# Patient Record
Sex: Male | Born: 1955 | Race: White | Hispanic: No | Marital: Married | Smoking: Never smoker
Health system: Southern US, Community
[De-identification: ages and names within clinical notes are randomized; demographics above are authoritative.]

## PROBLEM LIST (undated history)

## (undated) DIAGNOSIS — J45909 Unspecified asthma, uncomplicated: Secondary | ICD-10-CM

## (undated) HISTORY — DX: Unspecified asthma, uncomplicated: J45.909

## (undated) HISTORY — PX: HIP SURGERY: SHX245

---

## 2003-09-28 ENCOUNTER — Ambulatory Visit (HOSPITAL_COMMUNITY): Admission: RE | Admit: 2003-09-28 | Discharge: 2003-09-28 | Payer: Self-pay | Admitting: Gastroenterology

## 2005-05-02 ENCOUNTER — Ambulatory Visit (HOSPITAL_COMMUNITY): Admission: RE | Admit: 2005-05-02 | Discharge: 2005-05-02 | Payer: Self-pay | Admitting: Surgery

## 2014-04-16 ENCOUNTER — Other Ambulatory Visit: Payer: Self-pay | Admitting: Gastroenterology

## 2016-08-22 DIAGNOSIS — L820 Inflamed seborrheic keratosis: Secondary | ICD-10-CM | POA: Diagnosis not present

## 2016-08-22 DIAGNOSIS — D2262 Melanocytic nevi of left upper limb, including shoulder: Secondary | ICD-10-CM | POA: Diagnosis not present

## 2016-08-22 DIAGNOSIS — L814 Other melanin hyperpigmentation: Secondary | ICD-10-CM | POA: Diagnosis not present

## 2016-08-22 DIAGNOSIS — B078 Other viral warts: Secondary | ICD-10-CM | POA: Diagnosis not present

## 2016-08-22 DIAGNOSIS — L821 Other seborrheic keratosis: Secondary | ICD-10-CM | POA: Diagnosis not present

## 2017-04-13 DIAGNOSIS — Z23 Encounter for immunization: Secondary | ICD-10-CM | POA: Diagnosis not present

## 2017-05-29 DIAGNOSIS — Z13220 Encounter for screening for lipoid disorders: Secondary | ICD-10-CM | POA: Diagnosis not present

## 2017-05-29 DIAGNOSIS — Z0000 Encounter for general adult medical examination without abnormal findings: Secondary | ICD-10-CM | POA: Diagnosis not present

## 2017-06-26 DIAGNOSIS — R972 Elevated prostate specific antigen [PSA]: Secondary | ICD-10-CM | POA: Diagnosis not present

## 2017-07-23 DIAGNOSIS — H1013 Acute atopic conjunctivitis, bilateral: Secondary | ICD-10-CM | POA: Diagnosis not present

## 2017-07-30 DIAGNOSIS — H10413 Chronic giant papillary conjunctivitis, bilateral: Secondary | ICD-10-CM | POA: Diagnosis not present

## 2017-08-02 DIAGNOSIS — R972 Elevated prostate specific antigen [PSA]: Secondary | ICD-10-CM | POA: Diagnosis not present

## 2017-08-02 DIAGNOSIS — N401 Enlarged prostate with lower urinary tract symptoms: Secondary | ICD-10-CM | POA: Diagnosis not present

## 2017-10-02 DIAGNOSIS — D225 Melanocytic nevi of trunk: Secondary | ICD-10-CM | POA: Diagnosis not present

## 2017-10-02 DIAGNOSIS — L821 Other seborrheic keratosis: Secondary | ICD-10-CM | POA: Diagnosis not present

## 2017-10-30 DIAGNOSIS — Z8042 Family history of malignant neoplasm of prostate: Secondary | ICD-10-CM | POA: Diagnosis not present

## 2017-10-30 DIAGNOSIS — R972 Elevated prostate specific antigen [PSA]: Secondary | ICD-10-CM | POA: Diagnosis not present

## 2018-01-16 DIAGNOSIS — H0014 Chalazion left upper eyelid: Secondary | ICD-10-CM | POA: Diagnosis not present

## 2018-01-22 DIAGNOSIS — H0014 Chalazion left upper eyelid: Secondary | ICD-10-CM | POA: Diagnosis not present

## 2018-01-23 DIAGNOSIS — H0014 Chalazion left upper eyelid: Secondary | ICD-10-CM | POA: Diagnosis not present

## 2018-01-31 DIAGNOSIS — Z8042 Family history of malignant neoplasm of prostate: Secondary | ICD-10-CM | POA: Diagnosis not present

## 2018-01-31 DIAGNOSIS — N138 Other obstructive and reflux uropathy: Secondary | ICD-10-CM | POA: Diagnosis not present

## 2018-01-31 DIAGNOSIS — R972 Elevated prostate specific antigen [PSA]: Secondary | ICD-10-CM | POA: Diagnosis not present

## 2018-04-18 DIAGNOSIS — Z23 Encounter for immunization: Secondary | ICD-10-CM | POA: Diagnosis not present

## 2018-07-03 ENCOUNTER — Other Ambulatory Visit: Payer: Self-pay | Admitting: Family Medicine

## 2018-07-03 DIAGNOSIS — M8580 Other specified disorders of bone density and structure, unspecified site: Secondary | ICD-10-CM

## 2018-07-03 DIAGNOSIS — J4530 Mild persistent asthma, uncomplicated: Secondary | ICD-10-CM | POA: Diagnosis not present

## 2018-07-03 DIAGNOSIS — Z13220 Encounter for screening for lipoid disorders: Secondary | ICD-10-CM | POA: Diagnosis not present

## 2018-07-03 DIAGNOSIS — Z23 Encounter for immunization: Secondary | ICD-10-CM | POA: Diagnosis not present

## 2018-07-03 DIAGNOSIS — Z0000 Encounter for general adult medical examination without abnormal findings: Secondary | ICD-10-CM | POA: Diagnosis not present

## 2018-07-30 DIAGNOSIS — R972 Elevated prostate specific antigen [PSA]: Secondary | ICD-10-CM | POA: Diagnosis not present

## 2018-08-04 DIAGNOSIS — N401 Enlarged prostate with lower urinary tract symptoms: Secondary | ICD-10-CM | POA: Diagnosis not present

## 2018-08-04 DIAGNOSIS — N138 Other obstructive and reflux uropathy: Secondary | ICD-10-CM | POA: Diagnosis not present

## 2018-08-04 DIAGNOSIS — R972 Elevated prostate specific antigen [PSA]: Secondary | ICD-10-CM | POA: Diagnosis not present

## 2018-08-28 DIAGNOSIS — H0014 Chalazion left upper eyelid: Secondary | ICD-10-CM | POA: Diagnosis not present

## 2018-09-02 ENCOUNTER — Ambulatory Visit
Admission: RE | Admit: 2018-09-02 | Discharge: 2018-09-02 | Disposition: A | Discharge status: Home / Self Care | Payer: 59 | Source: Ambulatory Visit | Attending: Family Medicine | Admitting: Family Medicine

## 2018-09-02 ENCOUNTER — Other Ambulatory Visit: Payer: Self-pay

## 2018-09-02 DIAGNOSIS — M85851 Other specified disorders of bone density and structure, right thigh: Secondary | ICD-10-CM | POA: Diagnosis not present

## 2018-09-02 DIAGNOSIS — M8580 Other specified disorders of bone density and structure, unspecified site: Secondary | ICD-10-CM

## 2018-11-20 DIAGNOSIS — D225 Melanocytic nevi of trunk: Secondary | ICD-10-CM | POA: Diagnosis not present

## 2018-11-20 DIAGNOSIS — L570 Actinic keratosis: Secondary | ICD-10-CM | POA: Diagnosis not present

## 2018-11-20 DIAGNOSIS — D2262 Melanocytic nevi of left upper limb, including shoulder: Secondary | ICD-10-CM | POA: Diagnosis not present

## 2018-11-20 DIAGNOSIS — D2371 Other benign neoplasm of skin of right lower limb, including hip: Secondary | ICD-10-CM | POA: Diagnosis not present

## 2018-11-20 DIAGNOSIS — D2372 Other benign neoplasm of skin of left lower limb, including hip: Secondary | ICD-10-CM | POA: Diagnosis not present

## 2018-11-20 DIAGNOSIS — D2261 Melanocytic nevi of right upper limb, including shoulder: Secondary | ICD-10-CM | POA: Diagnosis not present

## 2018-12-08 DIAGNOSIS — H0014 Chalazion left upper eyelid: Secondary | ICD-10-CM | POA: Diagnosis not present

## 2019-02-02 DIAGNOSIS — N528 Other male erectile dysfunction: Secondary | ICD-10-CM | POA: Diagnosis not present

## 2019-02-02 DIAGNOSIS — N138 Other obstructive and reflux uropathy: Secondary | ICD-10-CM | POA: Diagnosis not present

## 2019-02-02 DIAGNOSIS — N401 Enlarged prostate with lower urinary tract symptoms: Secondary | ICD-10-CM | POA: Diagnosis not present

## 2019-02-02 DIAGNOSIS — R972 Elevated prostate specific antigen [PSA]: Secondary | ICD-10-CM | POA: Diagnosis not present

## 2019-02-02 DIAGNOSIS — Z8042 Family history of malignant neoplasm of prostate: Secondary | ICD-10-CM | POA: Diagnosis not present

## 2019-03-02 DIAGNOSIS — Z20828 Contact with and (suspected) exposure to other viral communicable diseases: Secondary | ICD-10-CM | POA: Diagnosis not present

## 2019-09-29 DIAGNOSIS — Z125 Encounter for screening for malignant neoplasm of prostate: Secondary | ICD-10-CM | POA: Diagnosis not present

## 2019-09-29 DIAGNOSIS — Z0000 Encounter for general adult medical examination without abnormal findings: Secondary | ICD-10-CM | POA: Diagnosis not present

## 2019-09-29 DIAGNOSIS — Z13220 Encounter for screening for lipoid disorders: Secondary | ICD-10-CM | POA: Diagnosis not present

## 2019-10-02 DIAGNOSIS — Z23 Encounter for immunization: Secondary | ICD-10-CM | POA: Diagnosis not present

## 2019-10-02 DIAGNOSIS — Z0000 Encounter for general adult medical examination without abnormal findings: Secondary | ICD-10-CM | POA: Diagnosis not present

## 2019-10-21 DIAGNOSIS — H0014 Chalazion left upper eyelid: Secondary | ICD-10-CM | POA: Diagnosis not present

## 2019-10-21 DIAGNOSIS — H5213 Myopia, bilateral: Secondary | ICD-10-CM | POA: Diagnosis not present

## 2020-01-01 DIAGNOSIS — R899 Unspecified abnormal finding in specimens from other organs, systems and tissues: Secondary | ICD-10-CM | POA: Diagnosis not present

## 2020-01-14 DIAGNOSIS — L570 Actinic keratosis: Secondary | ICD-10-CM | POA: Diagnosis not present

## 2020-01-14 DIAGNOSIS — C44519 Basal cell carcinoma of skin of other part of trunk: Secondary | ICD-10-CM | POA: Diagnosis not present

## 2020-01-14 DIAGNOSIS — L820 Inflamed seborrheic keratosis: Secondary | ICD-10-CM | POA: Diagnosis not present

## 2020-01-14 DIAGNOSIS — L821 Other seborrheic keratosis: Secondary | ICD-10-CM | POA: Diagnosis not present

## 2020-01-14 DIAGNOSIS — L814 Other melanin hyperpigmentation: Secondary | ICD-10-CM | POA: Diagnosis not present

## 2020-01-14 DIAGNOSIS — D1801 Hemangioma of skin and subcutaneous tissue: Secondary | ICD-10-CM | POA: Diagnosis not present

## 2020-01-14 DIAGNOSIS — D225 Melanocytic nevi of trunk: Secondary | ICD-10-CM | POA: Diagnosis not present

## 2020-10-27 ENCOUNTER — Ambulatory Visit (INDEPENDENT_AMBULATORY_CARE_PROVIDER_SITE_OTHER): Payer: Medicare Other | Admitting: Family Medicine

## 2020-10-27 ENCOUNTER — Other Ambulatory Visit: Payer: Self-pay

## 2020-10-27 ENCOUNTER — Encounter: Payer: Self-pay | Admitting: Family Medicine

## 2020-10-27 VITALS — BP 122/76 | HR 67 | Temp 97.2°F | Ht 73.0 in | Wt 197.0 lb

## 2020-10-27 DIAGNOSIS — Z1159 Encounter for screening for other viral diseases: Secondary | ICD-10-CM | POA: Insufficient documentation

## 2020-10-27 DIAGNOSIS — Z0000 Encounter for general adult medical examination without abnormal findings: Secondary | ICD-10-CM | POA: Insufficient documentation

## 2020-10-27 DIAGNOSIS — J4520 Mild intermittent asthma, uncomplicated: Secondary | ICD-10-CM

## 2020-10-27 DIAGNOSIS — E559 Vitamin D deficiency, unspecified: Secondary | ICD-10-CM | POA: Insufficient documentation

## 2020-10-27 LAB — CBC
HCT: 43.3 % (ref 39.0–52.0)
Hemoglobin: 14.6 g/dL (ref 13.0–17.0)
MCHC: 33.7 g/dL (ref 30.0–36.0)
MCV: 93.2 fl (ref 78.0–100.0)
Platelets: 214 10*3/uL (ref 150.0–400.0)
RBC: 4.65 Mil/uL (ref 4.22–5.81)
RDW: 14.3 % (ref 11.5–15.5)
WBC: 3.6 10*3/uL — ABNORMAL LOW (ref 4.0–10.5)

## 2020-10-27 LAB — HEPATIC FUNCTION PANEL
ALT: 17 U/L (ref 0–53)
AST: 17 U/L (ref 0–37)
Albumin: 4.2 g/dL (ref 3.5–5.2)
Alkaline Phosphatase: 75 U/L (ref 39–117)
Bilirubin, Direct: 0.1 mg/dL (ref 0.0–0.3)
Total Bilirubin: 0.6 mg/dL (ref 0.2–1.2)
Total Protein: 6.8 g/dL (ref 6.0–8.3)

## 2020-10-27 LAB — LIPID PANEL
Cholesterol: 175 mg/dL (ref 0–200)
HDL: 70.8 mg/dL (ref >39.00)
LDL Cholesterol: 92 mg/dL (ref 0–99)
NonHDL: 104.5
Total CHOL/HDL Ratio: 2
Triglycerides: 65 mg/dL (ref 0.0–149.0)
VLDL: 13 mg/dL (ref 0.0–40.0)

## 2020-10-27 LAB — URINALYSIS, ROUTINE W REFLEX MICROSCOPIC
Bilirubin Urine: NEGATIVE
Hgb urine dipstick: NEGATIVE
Ketones, ur: NEGATIVE
Leukocytes,Ua: NEGATIVE
Nitrite: NEGATIVE
Specific Gravity, Urine: 1.01 (ref 1.000–1.030)
Total Protein, Urine: NEGATIVE
Urine Glucose: NEGATIVE
Urobilinogen, UA: 0.2 (ref 0.0–1.0)
pH: 7 (ref 5.0–8.0)

## 2020-10-27 LAB — VITAMIN D 25 HYDROXY (VIT D DEFICIENCY, FRACTURES): VITD: 29.32 ng/mL — ABNORMAL LOW (ref 30.00–100.00)

## 2020-10-27 LAB — BASIC METABOLIC PANEL
BUN: 15 mg/dL (ref 6–23)
CO2: 30 mEq/L (ref 19–32)
Calcium: 9.1 mg/dL (ref 8.4–10.5)
Chloride: 103 mEq/L (ref 96–112)
Creatinine, Ser: 1.13 mg/dL (ref 0.40–1.50)
GFR: 68.24 mL/min (ref >60.00)
Glucose, Bld: 99 mg/dL (ref 70–99)
Potassium: 4.5 mEq/L (ref 3.5–5.1)
Sodium: 140 mEq/L (ref 135–145)

## 2020-10-27 LAB — PSA: PSA: 1.87 ng/mL (ref 0.10–4.00)

## 2020-10-27 NOTE — Progress Notes (Addendum)
Established Patient Office Visit  Subjective:  Patient ID: Joseph Jennings, male    DOB: 12/05/1955  Age: 65 y.o. MRN: [id number]  CC:  Chief Complaint  Patient presents with  . Establish Care    Np/establish care, no concerns. Patient fasting.     HPI Joseph Jennings presents for physical exam.  He is fasting.  Today because his physician is retiring.  Healthy.  History of mild asthma.  He uses Advair discus as needed for the times of year but his asthma seems to bother him.  History of chalazion.  History of vitamin D deficiency.  Had a colonoscopy 5 years ago and they advised him to return in 10 years.  He has physically active.  With several activities.  He lives with his wife Joseph Jennings.  Continues to work.  His children are grown and out of the house.  Past Medical History:  Diagnosis Date  . Asthma     Past Surgical History:  Procedure Laterality Date  . HIP SURGERY      Family History  Problem Relation Age of Onset  . Hypertension Mother     Social History   Socioeconomic History  . Marital status: Married    Spouse name: Not on file  . Number of children: Not on file  . Years of education: Not on file  . Highest education level: Not on file  Occupational History  . Not on file  Tobacco Use  . Smoking status: Never Smoker  . Smokeless tobacco: Never Used  Vaping Use  . Vaping Use: Never used  Substance and Sexual Activity  . Alcohol use: Yes    Alcohol/week: 2.0 standard drinks    Types: 1 Glasses of wine, 1 Cans of beer per week  . Drug use: Never  . Sexual activity: Yes  Other Topics Concern  . Not on file  Social History Narrative  . Not on file   Social Determinants of Health   Financial Resource Strain: Not on file  Food Insecurity: Not on file  Transportation Needs: Not on file  Physical Activity: Not on file  Stress: Not on file  Social Connections: Not on file  Intimate Partner Violence: Not on file    Outpatient Medications Prior to  Visit  Medication Sig Dispense Refill  . albuterol (VENTOLIN HFA) 108 (90 Base) MCG/ACT inhaler 2 puffs as needed    . fluticasone-salmeterol (ADVAIR) 100-50 MCG/ACT AEPB INHALE 1 PUFF EVERY 12 HOURS    . sildenafil (REVATIO) 20 MG tablet Take 2-5 tablets as needed    . Quercetin 50 MG TABS 1 tablet     No facility-administered medications prior to visit.    Not on File  ROS Review of Systems  Constitutional: Negative for diaphoresis, fatigue, fever and unexpected weight change.  HENT: Negative.   Eyes: Negative for photophobia and visual disturbance.  Respiratory: Negative.   Cardiovascular: Negative.   Gastrointestinal: Negative.   Endocrine: Negative for polyphagia and polyuria.  Genitourinary: Negative for difficulty urinating, frequency and urgency.  Musculoskeletal: Negative for gait problem and joint swelling.  Skin: Negative for color change and pallor.  Allergic/Immunologic: Negative for immunocompromised state.  Neurological: Negative for speech difficulty and weakness.  Hematological: Does not bruise/bleed easily.  Psychiatric/Behavioral: Negative.    Depression screen PHQ 2/9 10/27/2020  Decreased Interest 0  Down, Depressed, Hopeless 0  PHQ - 2 Score 0      Objective:    Physical Exam Vitals and nursing note reviewed.  Constitutional:      General: He is not in acute distress.    Appearance: Normal appearance. He is normal weight. He is not ill-appearing, toxic-appearing or diaphoretic.  HENT:     Head: Normocephalic and atraumatic.     Right Ear: Tympanic membrane, ear canal and external ear normal.     Left Ear: Tympanic membrane, ear canal and external ear normal.     Mouth/Throat:     Mouth: Mucous membranes are moist.     Pharynx: Oropharynx is clear. No oropharyngeal exudate or posterior oropharyngeal erythema.  Eyes:     General: No scleral icterus.       Right eye: No discharge.        Left eye: No discharge.     Extraocular Movements:  Extraocular movements intact.     Conjunctiva/sclera: Conjunctivae normal.     Pupils: Pupils are equal, round, and reactive to light.  Neck:     Vascular: No carotid bruit.  Cardiovascular:     Rate and Rhythm: Normal rate and regular rhythm.  Pulmonary:     Effort: Pulmonary effort is normal.     Breath sounds: Normal breath sounds.  Abdominal:     General: Abdomen is flat. Bowel sounds are normal. There is no distension.     Palpations: There is no mass.     Tenderness: There is no abdominal tenderness. There is no guarding or rebound.     Hernia: No hernia is present.  Genitourinary:    Prostate: Enlarged. Not tender and no nodules present.     Rectum: Guaiac result negative. No mass, tenderness, anal fissure, external hemorrhoid or internal hemorrhoid. Normal anal tone.  Musculoskeletal:     Cervical back: No rigidity or tenderness.  Lymphadenopathy:     Cervical: No cervical adenopathy.  Skin:    General: Skin is warm and dry.  Neurological:     Mental Status: He is alert and oriented to person, place, and time.  Psychiatric:        Mood and Affect: Mood normal.        Behavior: Behavior normal.     BP 122/76   Pulse 67   Temp (!) 97.2 F (36.2 C) (Temporal)   Ht 6' 1"  (1.854 m)   Wt 197 lb (89.4 kg)   SpO2 99%   BMI 25.99 kg/m  Wt Readings from Last 3 Encounters:  10/27/20 197 lb (89.4 kg)     Health Maintenance Due  Topic Date Due  . HIV Screening  Never done  . Hepatitis C Screening  Never done  . COLONOSCOPY (Pts 45-2yr Insurance coverage will need to be confirmed)  Never done  . PNA vac Low Risk Adult (1 of 2 - PCV13) Never done  . COVID-19 Vaccine (4 - Booster for Pfizer series) 08/27/2020    There are no preventive care reminders to display for this patient.  No results found for: TSH No results found for: WBC, HGB, HCT, MCV, PLT No results found for: NA, K, CHLORIDE, CO2, GLUCOSE, BUN, CREATININE, BILITOT, ALKPHOS, AST, ALT, PROT, ALBUMIN,  CALCIUM, ANIONGAP, EGFR, GFR No results found for: CHOL No results found for: HDL No results found for: LDLCALC No results found for: TRIG No results found for: CHOLHDL No results found for: HGBA1C    Assessment & Plan:   Problem List Items Addressed This Visit      Respiratory   Mild intermittent asthma without complication   Relevant Medications   fluticasone-salmeterol (ADVAIR)  100-50 MCG/ACT AEPB   albuterol (VENTOLIN HFA) 108 (90 Base) MCG/ACT inhaler   Other Relevant Orders   CBC     Other   Vitamin D deficiency   Relevant Orders   VITAMIN D 25 Hydroxy (Vit-D Deficiency, Fractures)   Healthcare maintenance - Primary   Relevant Orders   Basic metabolic panel   Hepatic function panel   Lipid panel   PSA   Urinalysis, Routine w reflex microscopic      No orders of the defined types were placed in this encounter.   Follow-up: Return in about 6 months (around 04/29/2021).   Given information on health maintenance and disease prevention.  Encouraged him to continue his healthy active lifestyle with riding his bike, yoga and yard work.  Continue using Advair as needed.  Checking vitamin D today.  Recommendation made on current levels. Libby Maw, MD

## 2020-10-27 NOTE — Patient Instructions (Signed)
Health Maintenance After Age 66 After age 64, you are at a higher risk for certain long-term diseases and infections as well as injuries from falls. Falls are a major cause of broken bones and head injuries in people who are older than age 41. Getting regular preventive care can help to keep you healthy and well. Preventive care includes getting regular testing and making lifestyle changes as recommended by your health care provider. Talk with your health care provider about:  Which screenings and tests you should have. A screening is a test that checks for a disease when you have no symptoms.  A diet and exercise plan that is right for you. What should I know about screenings and tests to prevent falls? Screening and testing are the best ways to find a health problem early. Early diagnosis and treatment give you the best chance of managing medical conditions that are common after age 19. Certain conditions and lifestyle choices may make you more likely to have a fall. Your health care provider may recommend:  Regular vision checks. Poor vision and conditions such as cataracts can make you more likely to have a fall. If you wear glasses, make sure to get your prescription updated if your vision changes.  Medicine review. Work with your health care provider to regularly review all of the medicines you are taking, including over-the-counter medicines. Ask your health care provider about any side effects that may make you more likely to have a fall. Tell your health care provider if any medicines that you take make you feel dizzy or sleepy.  Osteoporosis screening. Osteoporosis is a condition that causes the bones to get weaker. This can make the bones weak and cause them to break more easily.  Blood pressure screening. Blood pressure changes and medicines to control blood pressure can make you feel dizzy.  Strength and balance checks. Your health care provider may recommend certain tests to check your  strength and balance while standing, walking, or changing positions.  Foot health exam. Foot pain and numbness, as well as not wearing proper footwear, can make you more likely to have a fall.  Depression screening. You may be more likely to have a fall if you have a fear of falling, feel emotionally low, or feel unable to do activities that you used to do.  Alcohol use screening. Using too much alcohol can affect your balance and may make you more likely to have a fall. What actions can I take to lower my risk of falls? General instructions  Talk with your health care provider about your risks for falling. Tell your health care provider if: ? You fall. Be sure to tell your health care provider about all falls, even ones that seem minor. ? You feel dizzy, sleepy, or off-balance.  Take over-the-counter and prescription medicines only as told by your health care provider. These include any supplements.  Eat a healthy diet and maintain a healthy weight. A healthy diet includes low-fat dairy products, low-fat (lean) meats, and fiber from whole grains, beans, and lots of fruits and vegetables. Home safety  Remove any tripping hazards, such as rugs, cords, and clutter.  Install safety equipment such as grab bars in bathrooms and safety rails on stairs.  Keep rooms and walkways well-lit. Activity  Follow a regular exercise program to stay fit. This will help you maintain your balance. Ask your health care provider what types of exercise are appropriate for you.  If you need a cane or walker,  use it as recommended by your health care provider.  Wear supportive shoes that have nonskid soles.   Lifestyle  Do not drink alcohol if your health care provider tells you not to drink.  If you drink alcohol, limit how much you have: ? 0-1 drink a day for women. ? 0-2 drinks a day for men.  Be aware of how much alcohol is in your drink. In the [place] one drink equals one typical bottle of beer (12  oz), one-half glass of wine (5 oz), or one shot of hard liquor (1 oz).  Do not use any products that contain nicotine or tobacco, such as cigarettes and e-cigarettes. If you need help quitting, ask your health care provider. Summary  Having a healthy lifestyle and getting preventive care can help to protect your health and wellness after age 95.  Screening and testing are the best way to find a health problem early and help you avoid having a fall. Early diagnosis and treatment give you the best chance for managing medical conditions that are more common for people who are older than age 50.  Falls are a major cause of broken bones and head injuries in people who are older than age 72. Take precautions to prevent a fall at home.  Work with your health care provider to learn what changes you can make to improve your health and wellness and to prevent falls. This information is not intended to replace advice given to you by your health care provider. Make sure you discuss any questions you have with your health care provider. Document Revised: 09/25/2018 Document Reviewed: 04/17/2017 Elsevier Patient Education  [place] 65 Years and Older, Male Preventive care refers to lifestyle choices and visits with your health care provider that can promote health and wellness. This includes:  A yearly physical exam. This is also called an annual wellness visit.  Regular dental and eye exams.  Immunizations.  Screening for certain conditions.  Healthy lifestyle choices, such as: ? Eating a healthy diet. ? Getting regular exercise. ? Not using drugs or products that contain nicotine and tobacco. ? Limiting alcohol use. What can I expect for my preventive care visit? Physical exam Your health care provider will check your:  Height and weight. These may be used to calculate your BMI (body mass index). BMI is a measurement that tells if you are at a healthy  weight.  Heart rate and blood pressure.  Body temperature.  Skin for abnormal spots. Counseling Your health care provider may ask you questions about your:  Past medical problems.  Family's medical history.  Alcohol, tobacco, and drug use.  Emotional well-being.  Home life and relationship well-being.  Sexual activity.  Diet, exercise, and sleep habits.  History of falls.  Memory and ability to understand (cognition).  Work and work Statistician.  Access to firearms. What immunizations do I need? Vaccines are usually given at various ages, according to a schedule. Your health care provider will recommend vaccines for you based on your age, medical history, and lifestyle or other factors, such as travel or where you work.   What tests do I need? Blood tests  Lipid and cholesterol levels. These may be checked every 5 years, or more often depending on your overall health.  Hepatitis C test.  Hepatitis B test. Screening  Lung cancer screening. You may have this screening every year starting at age 25 if you have a 30-pack-year history of smoking and currently  smoke or have quit within the past 15 years.  Colorectal cancer screening. ? All adults should have this screening starting at age 14 and continuing until age 69. ? Your health care provider may recommend screening at age 10 if you are at increased risk. ? You will have tests every 1-10 years, depending on your results and the type of screening test.  Prostate cancer screening. Recommendations will vary depending on your family history and other risks.  Genital exam to check for testicular cancer or hernias.  Diabetes screening. ? This is done by checking your blood sugar (glucose) after you have not eaten for a while (fasting). ? You may have this done every 1-3 years.  Abdominal aortic aneurysm (AAA) screening. You may need this if you are a current or former smoker.  STD (sexually transmitted disease)  testing, if you are at risk. Follow these instructions at home: Eating and drinking  Eat a diet that includes fresh fruits and vegetables, whole grains, lean protein, and low-fat dairy products. Limit your intake of foods with high amounts of sugar, saturated fats, and salt.  Take vitamin and mineral supplements as recommended by your health care provider.  Do not drink alcohol if your health care provider tells you not to drink.  If you drink alcohol: ? Limit how much you have to 0-2 drinks a day. ? Be aware of how much alcohol is in your drink. In the [place] one drink equals one 12 oz bottle of beer (355 mL), one 5 oz glass of wine (148 mL), or one 1 oz glass of hard liquor (44 mL).   Lifestyle  Take daily care of your teeth and gums. Brush your teeth every morning and night with fluoride toothpaste. Floss one time each day.  Stay active. Exercise for at least 30 minutes 5 or more days each week.  Do not use any products that contain nicotine or tobacco, such as cigarettes, e-cigarettes, and chewing tobacco. If you need help quitting, ask your health care provider.  Do not use drugs.  If you are sexually active, practice safe sex. Use a condom or other form of protection to prevent STIs (sexually transmitted infections).  Talk with your health care provider about taking a low-dose aspirin or statin.  Find healthy ways to cope with stress, such as: ? Meditation, yoga, or listening to music. ? Journaling. ? Talking to a trusted person. ? Spending time with friends and family. Safety  Always wear your seat belt while driving or riding in a vehicle.  Do not drive: ? If you have been drinking alcohol. Do not ride with someone who has been drinking. ? When you are tired or distracted. ? While texting.  Wear a helmet and other protective equipment during sports activities.  If you have firearms in your house, make sure you follow all gun safety procedures. What's next?  Visit  your health care provider once a year for an annual wellness visit.  Ask your health care provider how often you should have your eyes and teeth checked.  Stay up to date on all vaccines. This information is not intended to replace advice given to you by your health care provider. Make sure you discuss any questions you have with your health care provider. Document Revised: 03/03/2019 Document Reviewed: 05/29/2018 Elsevier Patient Education  2021 [employer].

## 2020-10-28 ENCOUNTER — Encounter: Payer: Self-pay | Admitting: Family Medicine

## 2020-10-28 DIAGNOSIS — N5201 Erectile dysfunction due to arterial insufficiency: Secondary | ICD-10-CM

## 2020-10-31 MED ORDER — SILDENAFIL CITRATE 20 MG PO TABS: ORAL_TABLET | ORAL | 2 refills | Status: DC

## 2020-10-31 NOTE — Telephone Encounter (Signed)
Last OV 10/28/20 Last fill Historical provider

## 2020-11-21 ENCOUNTER — Encounter: Payer: Self-pay | Admitting: Nurse Practitioner

## 2020-11-21 ENCOUNTER — Telehealth (INDEPENDENT_AMBULATORY_CARE_PROVIDER_SITE_OTHER): Payer: Medicare Other | Admitting: Nurse Practitioner

## 2020-11-21 VITALS — Ht 74.0 in | Wt 190.0 lb

## 2020-11-21 DIAGNOSIS — J209 Acute bronchitis, unspecified: Secondary | ICD-10-CM

## 2020-11-21 MED ORDER — BENZONATATE 100 MG PO CAPS: 100.0000 mg | ORAL_CAPSULE | Freq: Three times a day (TID) | ORAL | 0 refills | Status: DC | PRN

## 2020-11-21 MED ORDER — PREDNISONE 10 MG (21) PO TBPK
ORAL_TABLET | ORAL | 0 refills | Status: DC
Start: 2020-11-21 — End: 2021-08-16

## 2020-11-21 MED ORDER — ALBUTEROL SULFATE HFA 108 (90 BASE) MCG/ACT IN AERS: 1.0000 | INHALATION_SPRAY | Freq: Four times a day (QID) | RESPIRATORY_TRACT | 0 refills | Status: DC | PRN

## 2020-11-21 NOTE — Patient Instructions (Signed)
Maintain adequate oral hydration Hold advair while using oral prednisone. You can resume after completion. Call office if no improvement in 1week

## 2020-11-21 NOTE — Progress Notes (Signed)
Virtual Visit via Video Note  I connected with@ on 11/21/20 at 11:30 AM EDT by a video enabled telemedicine application and verified that I am speaking with the correct person using two identifiers.  Location: Patient:Home Provider: Office Participants: patient and provider  I discussed the limitations of evaluation and management by telemedicine and the availability of in person appointments. I also discussed with the patient that there may be a patient responsible charge related to this service. The patient expressed understanding and agreed to proceed.  CC:Pt c/o cough and congestion x 2 weeks. Pt has took otc medication (robitussin)with no relief. Coughing causing him to wake from sleep every 15 min  History of Present Illness: Cough This is a new problem. The current episode started 1 to 4 weeks ago. The problem has been unchanged. The problem occurs constantly. The cough is productive of sputum. Associated symptoms include nasal congestion, postnasal drip and rhinorrhea. Pertinent negatives include no chest pain, chills, fever, heartburn, myalgias, sore throat, shortness of breath, sweats, weight loss or wheezing. The symptoms are aggravated by lying down. He has tried steroid inhaler and OTC cough suppressant for the symptoms. The treatment provided no relief. His past medical history is significant for asthma.  denies any recent travel or participation in large [employer].  Observations/Objective: Physical Exam Pulmonary:     Effort: Pulmonary effort is normal.  Neurological:     Mental Status: He is alert and oriented to person, place, and time.    Assessment and Plan: Joseph Jennings was seen today for acute visit.  Diagnoses and all orders for this visit:  Acute bronchitis, unspecified organism -     albuterol (VENTOLIN HFA) 108 (90 Base) MCG/ACT inhaler; Inhale 1-2 puffs into the lungs every 6 (six) hours as needed for wheezing or shortness of breath. -     predniSONE (STERAPRED  UNI-PAK 21 TAB) 10 MG (21) TBPK tablet; As directed on package -     benzonatate (TESSALON) 100 MG capsule; Take 1 capsule (100 mg total) by mouth 3 (three) times daily as needed.   Follow Up Instructions: Maintain adequate oral hydration Hold advair while using oral prednisone. You can resume after completion. Call office if no improvement in 1week   I discussed the assessment and treatment plan with the patient. The patient was provided an opportunity to ask questions and all were answered. The patient agreed with the plan and demonstrated an understanding of the instructions.   The patient was advised to call back or seek an in-person evaluation if the symptoms worsen or if the condition fails to improve as anticipated.  Wilfred Lacy, NP

## 2021-04-13 ENCOUNTER — Other Ambulatory Visit: Payer: Self-pay | Admitting: Orthopedic Surgery

## 2021-04-13 ENCOUNTER — Ambulatory Visit
Admission: RE | Admit: 2021-04-13 | Discharge: 2021-04-13 | Disposition: A | Discharge status: Home / Self Care | Payer: Medicare Other | Source: Ambulatory Visit | Attending: Orthopedic Surgery | Admitting: Orthopedic Surgery

## 2021-04-13 DIAGNOSIS — Z96642 Presence of left artificial hip joint: Secondary | ICD-10-CM

## 2021-05-01 ENCOUNTER — Ambulatory Visit: Payer: Medicare Other | Admitting: Family Medicine

## 2021-05-22 ENCOUNTER — Telehealth: Payer: Self-pay | Admitting: Family Medicine

## 2021-05-22 NOTE — Telephone Encounter (Signed)
Left message for patient to call me back at [phone] to schedule Medicare Annual Wellness Visit   No hx of AWV eligible as of 04/18/21  Please schedule at anytime with [facility] Nurse Health Advisor if patient calls the office back.    79 Minutes appointment   Any questions, please call me at [phone]

## 2021-05-31 ENCOUNTER — Ambulatory Visit (INDEPENDENT_AMBULATORY_CARE_PROVIDER_SITE_OTHER): Payer: Medicare Other

## 2021-05-31 DIAGNOSIS — Z0000 Encounter for general adult medical examination without abnormal findings: Secondary | ICD-10-CM | POA: Diagnosis not present

## 2021-05-31 NOTE — Progress Notes (Addendum)
Subjective:   Joseph Jennings is a 65 y.o. male who presents for an Initial Medicare Annual Wellness Visit.  I connected with Joseph Jennings today by telephone and verified that I am speaking with the correct person using two identifiers. Location patient: home Location provider: work Persons participating in the virtual visit: patient, provider.   I discussed the limitations, risks, security and privacy concerns of performing an evaluation and management service by telephone and the availability of in person appointments. I also discussed with the patient that there may be a patient responsible charge related to this service. The patient expressed understanding and verbally consented to this telephonic visit.    Interactive audio and video telecommunications were attempted between this provider and patient, however failed, due to patient having technical difficulties OR patient did not have access to video capability.  We continued and completed visit with audio only.    Review of Systems     Cardiac Risk Factors include: advanced age (>18men, >3 women);male gender     Objective:    Today's Vitals   There is no height or weight on file to calculate BMI.  Advanced Directives 05/31/2021  Does Patient Have a Medical Advance Directive? Yes  Type of Paramedic of [place];Living will  Copy of [place] in Chart? No - copy requested    Current Medications (verified) Outpatient Encounter Medications as of 05/31/2021  Medication Sig   albuterol (VENTOLIN HFA) 108 (90 Base) MCG/ACT inhaler Inhale 1-2 puffs into the lungs every 6 (six) hours as needed for wheezing or shortness of breath.   fluticasone-salmeterol (ADVAIR) 100-50 MCG/ACT AEPB INHALE 1 PUFF EVERY 12 HOURS   Quercetin 50 MG TABS 1 tablet   sildenafil (REVATIO) 20 MG tablet May take 3-5 tablets 45 minutes prior to intercourse.   benzonatate (TESSALON) 100 MG capsule Take 1 capsule  (100 mg total) by mouth 3 (three) times daily as needed.   predniSONE (STERAPRED UNI-PAK 21 TAB) 10 MG (21) TBPK tablet As directed on package   No facility-administered encounter medications on file as of 05/31/2021.    Allergies (verified) Cetirizine and Other   History: Past Medical History:  Diagnosis Date   Asthma    Past Surgical History:  Procedure Laterality Date   HIP SURGERY     Family History  Problem Relation Age of Onset   Hypertension Mother    Social History   Socioeconomic History   Marital status: Married    Spouse name: Not on file   Number of children: Not on file   Years of education: Not on file   Highest education level: Not on file  Occupational History   Not on file  Tobacco Use   Smoking status: Never   Smokeless tobacco: Never  Vaping Use   Vaping Use: Never used  Substance and Sexual Activity   Alcohol use: Yes    Alcohol/week: 2.0 standard drinks    Types: 1 Glasses of wine, 1 Cans of beer per week   Drug use: Never   Sexual activity: Yes  Other Topics Concern   Not on file  Social History Narrative   Not on file   Social Determinants of Health   Financial Resource Strain: Low Risk    Difficulty of Paying Living Expenses: Not hard at all  Food Insecurity: No Food Insecurity   Worried About Charity fundraiser in the Last Year: Never true   [place] in the  Last Year: Never true  Transportation Needs: No Transportation Needs   Lack of Transportation (Medical): No   Lack of Transportation (Non-Medical): No  Physical Activity: Sufficiently Active   Days of Exercise per Week: 4 days   Minutes of Exercise per Session: 60 min  Stress: No Stress Concern Present   Feeling of Stress : Not at all  Social Connections: Socially Integrated   Frequency of Communication with Friends and Family: Twice a week   Frequency of Social Gatherings with Friends and Family: Twice a week   Attends Religious Services: More than 4 times per year    Active Member of [employer] or Organizations: Yes   Attends Music therapist: More than 4 times per year   Marital Status: Married    Tobacco Counseling Counseling given: Not Answered   Clinical Intake:  Pre-visit preparation completed: Yes  Pain : No/denies pain     Nutritional Risks: None Diabetes: No  How often do you need to have someone help you when you read instructions, pamphlets, or other written materials from your doctor or pharmacy?: 1 - Never What is the last grade level you completed in school?: masters  Diabetic?no   Interpreter Needed?: No  Information entered by :: L.Maximino Cozzolino,LPN   Activities of Daily Living In your present state of health, do you have any difficulty performing the following activities: 05/31/2021  Hearing? N  Vision? N  Difficulty concentrating or making decisions? N  Walking or climbing stairs? N  Dressing or bathing? N  Doing errands, shopping? N  Preparing Food and eating ? N  Using the Toilet? N  In the past six months, have you accidently leaked urine? N  Do you have problems with loss of bowel control? N  Managing your Medications? N  Managing your Finances? N  Housekeeping or managing your Housekeeping? N  Some recent data might be hidden    Patient Care Team: Libby Maw, MD as PCP - General (Family Medicine)  Indicate any recent Medical Services you may have received from other than [facility] providers in the past year (date may be approximate).     Assessment:   This is a routine wellness examination for Joseph Jennings.  Hearing/Vision screen Vision Screening - Comments:: Annual eye exams wear glasses/contacts   Dietary issues and exercise activities discussed: Current Exercise Habits: Home exercise routine, Type of exercise: strength training/weights, Time (Minutes): 60, Frequency (Times/Week): 4, Weekly Exercise (Minutes/Week): 240, Intensity: Mild, Exercise limited by: None identified   Goals  Addressed   None    Depression Screen PHQ 2/9 Scores 05/31/2021 05/31/2021 05/31/2021 10/27/2020  PHQ - 2 Score 0 0 0 0    Fall Risk Fall Risk  05/31/2021 05/31/2021 11/21/2020 10/27/2020  Falls in the past year? 0 0 0 0  Number falls in past yr: 0 0 0 -  Injury with Fall? 0 0 0 -  Risk for fall due to : - - No Fall Risks -  Follow up Falls evaluation completed Falls evaluation completed Falls evaluation completed -    FALL RISK PREVENTION PERTAINING TO THE HOME:  Any stairs in or around the home? Yes  If so, are there any without handrails? No  Home free of loose throw rugs in walkways, pet beds, electrical cords, etc? Yes  Adequate lighting in your home to reduce risk of falls? Yes   ASSISTIVE DEVICES UTILIZED TO PREVENT FALLS:  Life alert? No  Use of a cane, walker or w/c? No  Grab  bars in the bathroom? No  Shower chair or bench in shower? Yes  Elevated toilet seat or a handicapped toilet? Yes    Cognitive Function:Normal cognitive status assessed by direct observation by this Nurse Health Advisor. No abnormalities found.          Immunizations Immunization History  Administered Date(s) Administered   Hepatitis A, Adult 07/03/2018, 10/02/2019   Influenza Split 03/27/2012, 02/18/2014, 03/28/2016, 04/20/2017, 03/29/2018, 04/04/2019   Influenza,inj,Quad PF,6+ Mos 04/18/2018   Influenza,inj,quad, With Preservative 03/17/2015   PFIZER(Purple Top)SARS-COV-2 Vaccination 08/28/2019, 09/18/2019, 05/29/2020   Tdap 02/12/2012, 07/03/2018    TDAP status: Up to date  Flu Vaccine status: Up to date  Pneumococcal vaccine status: Up to date  Covid-19 vaccine status: Completed vaccines  Qualifies for Shingles Vaccine? Yes   Zostavax completed No   Shingrix Completed?: No.    Education has been provided regarding the importance of this vaccine. Patient has been advised to call insurance company to determine out of pocket expense if they have not yet received this vaccine.  Advised may also receive vaccine at local pharmacy or Health Dept. Verbalized acceptance and understanding.  Screening Tests Health Maintenance  Topic Date Due   Pneumonia Vaccine 23+ Years old (1 - PCV) Never done   Hepatitis C Screening  Never done   COLONOSCOPY (Pts 45-81yrs Insurance coverage will need to be confirmed)  Never done   Zoster Vaccines- Shingrix (1 of 2) Never done   COVID-19 Vaccine (4 - Booster for Pfizer series) 07/24/2020   INFLUENZA VACCINE  01/16/2021   TETANUS/TDAP  07/03/2028   HPV VACCINES  Aged Out    Health Maintenance  Health Maintenance Due  Topic Date Due   Pneumonia Vaccine 26+ Years old (1 - PCV) Never done   Hepatitis C Screening  Never done   COLONOSCOPY (Pts 45-24yrs Insurance coverage will need to be confirmed)  Never done   Zoster Vaccines- Shingrix (1 of 2) Never done   COVID-19 Vaccine (4 - Booster for [place] series) 07/24/2020   INFLUENZA VACCINE  01/16/2021    Colorectal cancer screening: Type of screening: Colonoscopy. Completed per patient 04/16/2014. Repeat every 10 years Ad colonoscopy 5 years ago with Dr. Rex Kras no record in chart  Lung Cancer Screening: (Low Dose CT Chest recommended if Age 33-80 years, 30 pack-year currently smoking OR have quit w/in 15years.) does not qualify.   Lung Cancer Screening Referral: n/a  Additional Screening:  Hepatitis C Screening: does qualify;   Vision Screening: Recommended annual ophthalmology exams for early detection of glaucoma and other disorders of the eye. Is the patient up to date with their annual eye exam?  Yes  Who is the provider or what is the name of the office in which the patient attends annual eye exams? Dr.Lyles  If pt is not established with a provider, would they like to be referred to a provider to establish care? No .   Dental Screening: Recommended annual dental exams for proper oral hygiene  Community Resource Referral / Chronic Care Management: CRR required this  visit?  No   CCM required this visit?  No      Plan:     I have personally reviewed and noted the following in the patients chart:   Medical and social history Use of alcohol, tobacco or illicit drugs  Current medications and supplements including opioid prescriptions. Patient is not currently taking opioid prescriptions. Functional ability and status Nutritional status Physical activity Advanced directives List of other physicians Hospitalizations,  surgeries, and ER visits in previous 12 months Vitals Screenings to include cognitive, depression, and falls Referrals and appointments  In addition, I have reviewed and discussed with patient certain preventive protocols, quality metrics, and best practice recommendations. A written personalized care plan for preventive services as well as general preventive health recommendations were provided to patient.     Randel Pigg, LPN   19/16/6060   Nurse Notes: none

## 2021-05-31 NOTE — Patient Instructions (Addendum)
Mr. Rosevear , Thank you for taking time to come for your Medicare Wellness Visit. I appreciate your ongoing commitment to your health goals. Please review the following plan we discussed and let me know if I can assist you in the future.   Screening recommendations/referrals: Colonoscopy: Per patient completed 5 years ago no record in chart 04/16/2014 Recommended yearly ophthalmology/optometry visit for glaucoma screening and checkup Recommended yearly dental visit for hygiene and checkup  Vaccinations: Influenza vaccine: completed  Pneumococcal vaccine: completed  Tdap vaccine: 07/03/2018 Shingles vaccine: will consider     Advanced directives: will provide copies   Conditions/risks identified: none   Next appointment: none   Preventive Care 66 Years and Older, Male Preventive care refers to lifestyle choices and visits with your health care provider that can promote health and wellness. What does preventive care include? A yearly physical exam. This is also called an annual well check. Dental exams once or twice a year. Routine eye exams. Ask your health care provider how often you should have your eyes checked. Personal lifestyle choices, including: Daily care of your teeth and gums. Regular physical activity. Eating a healthy diet. Avoiding tobacco and drug use. Limiting alcohol use. Practicing safe sex. Taking low doses of aspirin every day. Taking vitamin and mineral supplements as recommended by your health care provider. What happens during an annual well check? The services and screenings done by your health care provider during your annual well check will depend on your age, overall health, lifestyle risk factors, and family history of disease. Counseling  Your health care provider may ask you questions about your: Alcohol use. Tobacco use. Drug use. Emotional well-being. Home and relationship well-being. Sexual activity. Eating habits. History of falls. Memory  and ability to understand (cognition). Work and work Statistician. Screening  You may have the following tests or measurements: Height, weight, and BMI. Blood pressure. Lipid and cholesterol levels. These may be checked every 5 years, or more frequently if you are over 74 years old. Skin check. Lung cancer screening. You may have this screening every year starting at age 91 if you have a 30-pack-year history of smoking and currently smoke or have quit within the past 15 years. Fecal occult blood test (FOBT) of the stool. You may have this test every year starting at age 52. Flexible sigmoidoscopy or colonoscopy. You may have a sigmoidoscopy every 5 years or a colonoscopy every 10 years starting at age 81. Prostate cancer screening. Recommendations will vary depending on your family history and other risks. Hepatitis C blood test. Hepatitis B blood test. Sexually transmitted disease (STD) testing. Diabetes screening. This is done by checking your blood sugar (glucose) after you have not eaten for a while (fasting). You may have this done every 1-3 years. Abdominal aortic aneurysm (AAA) screening. You may need this if you are a current or former smoker. Osteoporosis. You may be screened starting at age 23 if you are at high risk. Talk with your health care provider about your test results, treatment options, and if necessary, the need for more tests. Vaccines  Your health care provider may recommend certain vaccines, such as: Influenza vaccine. This is recommended every year. Tetanus, diphtheria, and acellular pertussis (Tdap, Td) vaccine. You may need a Td booster every 10 years. Zoster vaccine. You may need this after age 11. Pneumococcal 13-valent conjugate (PCV13) vaccine. One dose is recommended after age 40. Pneumococcal polysaccharide (PPSV23) vaccine. One dose is recommended after age 73. Talk to your health care  provider about which screenings and vaccines you need and how often you  need them. This information is not intended to replace advice given to you by your health care provider. Make sure you discuss any questions you have with your health care provider. Document Released: 07/01/2015 Document Revised: 02/22/2016 Document Reviewed: 04/05/2015 Elsevier Interactive Patient Education  2017 [employer] Prevention in the Home Falls can cause injuries. They can happen to people of all ages. There are many things you can do to make your home safe and to help prevent falls. What can I do on the outside of my home? Regularly fix the edges of walkways and driveways and fix any cracks. Remove anything that might make you trip as you walk through a door, such as a raised step or threshold. Trim any bushes or trees on the path to your home. Use bright outdoor lighting. Clear any walking paths of anything that might make someone trip, such as rocks or tools. Regularly check to see if handrails are loose or broken. Make sure that both sides of any steps have handrails. Any raised decks and porches should have guardrails on the edges. Have any leaves, snow, or ice cleared regularly. Use sand or salt on walking paths during winter. Clean up any spills in your garage right away. This includes oil or grease spills. What can I do in the bathroom? Use night lights. Install grab bars by the toilet and in the tub and shower. Do not use towel bars as grab bars. Use non-skid mats or decals in the tub or shower. If you need to sit down in the shower, use a plastic, non-slip stool. Keep the floor dry. Clean up any water that spills on the floor as soon as it happens. Remove soap buildup in the tub or shower regularly. Attach bath mats securely with double-sided non-slip rug tape. Do not have throw rugs and other things on the floor that can make you trip. What can I do in the bedroom? Use night lights. Make sure that you have a light by your bed that is easy to reach. Do not  use any sheets or blankets that are too big for your bed. They should not hang down onto the floor. Have a firm chair that has side arms. You can use this for support while you get dressed. Do not have throw rugs and other things on the floor that can make you trip. What can I do in the kitchen? Clean up any spills right away. Avoid walking on wet floors. Keep items that you use a lot in easy-to-reach places. If you need to reach something above you, use a strong step stool that has a grab bar. Keep electrical cords out of the way. Do not use floor polish or wax that makes floors slippery. If you must use wax, use non-skid floor wax. Do not have throw rugs and other things on the floor that can make you trip. What can I do with my stairs? Do not leave any items on the stairs. Make sure that there are handrails on both sides of the stairs and use them. Fix handrails that are broken or loose. Make sure that handrails are as long as the stairways. Check any carpeting to make sure that it is firmly attached to the stairs. Fix any carpet that is loose or worn. Avoid having throw rugs at the top or bottom of the stairs. If you do have throw rugs, attach them to the  floor with carpet tape. Make sure that you have a light switch at the top of the stairs and the bottom of the stairs. If you do not have them, ask someone to add them for you. What else can I do to help prevent falls? Wear shoes that: Do not have high heels. Have rubber bottoms. Are comfortable and fit you well. Are closed at the toe. Do not wear sandals. If you use a stepladder: Make sure that it is fully opened. Do not climb a closed stepladder. Make sure that both sides of the stepladder are locked into place. Ask someone to hold it for you, if possible. Clearly mark and make sure that you can see: Any grab bars or handrails. First and last steps. Where the edge of each step is. Use tools that help you move around (mobility  aids) if they are needed. These include: Canes. Walkers. Scooters. Crutches. Turn on the lights when you go into a dark area. Replace any light bulbs as soon as they burn out. Set up your furniture so you have a clear path. Avoid moving your furniture around. If any of your floors are uneven, fix them. If there are any pets around you, be aware of where they are. Review your medicines with your doctor. Some medicines can make you feel dizzy. This can increase your chance of falling. Ask your doctor what other things that you can do to help prevent falls. This information is not intended to replace advice given to you by your health care provider. Make sure you discuss any questions you have with your health care provider. Document Released: 03/31/2009 Document Revised: 11/10/2015 Document Reviewed: 07/09/2014 Elsevier Interactive Patient Education  2017 [employer].

## 2021-08-05 ENCOUNTER — Encounter: Payer: Self-pay | Admitting: Family Medicine

## 2021-08-05 DIAGNOSIS — J4520 Mild intermittent asthma, uncomplicated: Secondary | ICD-10-CM

## 2021-08-07 ENCOUNTER — Other Ambulatory Visit: Payer: Self-pay

## 2021-08-07 DIAGNOSIS — J209 Acute bronchitis, unspecified: Secondary | ICD-10-CM

## 2021-08-07 MED ORDER — ALBUTEROL SULFATE HFA 108 (90 BASE) MCG/ACT IN AERS: 1.0000 | INHALATION_SPRAY | Freq: Four times a day (QID) | RESPIRATORY_TRACT | 0 refills | Status: AC | PRN

## 2021-08-09 ENCOUNTER — Ambulatory Visit: Payer: Medicare Other

## 2021-08-14 ENCOUNTER — Telehealth: Payer: Self-pay | Admitting: Family Medicine

## 2021-08-14 ENCOUNTER — Other Ambulatory Visit: Payer: Self-pay

## 2021-08-14 NOTE — Telephone Encounter (Signed)
Pt does not want albuterol (VENTOLIN HFA) 108 (90 Base) MCG/ACT inhaler [[id number]]. He is wanting Advair Diskus instead.  Pharmacy [facility]  fax# [phone]

## 2021-08-16 ENCOUNTER — Other Ambulatory Visit: Payer: Self-pay

## 2021-08-16 MED ORDER — FLUTICASONE-SALMETEROL 100-50 MCG/ACT IN AEPB: INHALATION_SPRAY | RESPIRATORY_TRACT | 2 refills | Status: DC

## 2021-08-16 NOTE — Telephone Encounter (Signed)
Rx printed, signed and faxed to requested number ?

## 2021-11-01 ENCOUNTER — Encounter: Payer: Self-pay | Admitting: Family Medicine

## 2021-11-01 ENCOUNTER — Ambulatory Visit (INDEPENDENT_AMBULATORY_CARE_PROVIDER_SITE_OTHER): Payer: Medicare Other | Admitting: Family Medicine

## 2021-11-01 VITALS — BP 122/76 | HR 62 | Temp 97.6°F | Ht 74.0 in | Wt 195.6 lb

## 2021-11-01 DIAGNOSIS — E559 Vitamin D deficiency, unspecified: Secondary | ICD-10-CM | POA: Diagnosis not present

## 2021-11-01 DIAGNOSIS — E041 Nontoxic single thyroid nodule: Secondary | ICD-10-CM | POA: Diagnosis not present

## 2021-11-01 DIAGNOSIS — Z136 Encounter for screening for cardiovascular disorders: Secondary | ICD-10-CM

## 2021-11-01 DIAGNOSIS — Z0000 Encounter for general adult medical examination without abnormal findings: Secondary | ICD-10-CM

## 2021-11-01 DIAGNOSIS — D72819 Decreased white blood cell count, unspecified: Secondary | ICD-10-CM | POA: Diagnosis not present

## 2021-11-01 DIAGNOSIS — Z23 Encounter for immunization: Secondary | ICD-10-CM

## 2021-11-01 DIAGNOSIS — Z125 Encounter for screening for malignant neoplasm of prostate: Secondary | ICD-10-CM

## 2021-11-01 DIAGNOSIS — Z1159 Encounter for screening for other viral diseases: Secondary | ICD-10-CM | POA: Diagnosis not present

## 2021-11-01 LAB — CBC WITH DIFFERENTIAL/PLATELET
Basophils Absolute: 0 10*3/uL (ref 0.0–0.1)
Basophils Relative: 0.6 % (ref 0.0–3.0)
Eosinophils Absolute: 0.1 10*3/uL (ref 0.0–0.7)
Eosinophils Relative: 2.3 % (ref 0.0–5.0)
HCT: 44.8 % (ref 39.0–52.0)
Hemoglobin: 15.1 g/dL (ref 13.0–17.0)
Lymphocytes Relative: 36.4 % (ref 12.0–46.0)
Lymphs Abs: 1.1 10*3/uL (ref 0.7–4.0)
MCHC: 33.7 g/dL (ref 30.0–36.0)
MCV: 94.8 fl (ref 78.0–100.0)
Monocytes Absolute: 0.3 10*3/uL (ref 0.1–1.0)
Monocytes Relative: 10.5 % (ref 3.0–12.0)
Neutro Abs: 1.6 10*3/uL (ref 1.4–7.7)
Neutrophils Relative %: 50.2 % (ref 43.0–77.0)
Platelets: 176 10*3/uL (ref 150.0–400.0)
RBC: 4.73 Mil/uL (ref 4.22–5.81)
RDW: 13.6 % (ref 11.5–15.5)
WBC: 3.1 10*3/uL — ABNORMAL LOW (ref 4.0–10.5)

## 2021-11-01 LAB — URINALYSIS
Bilirubin Urine: NEGATIVE
Hgb urine dipstick: NEGATIVE
Ketones, ur: NEGATIVE
Leukocytes,Ua: NEGATIVE
Nitrite: NEGATIVE
Specific Gravity, Urine: 1.01 (ref 1.000–1.030)
Total Protein, Urine: NEGATIVE
Urine Glucose: NEGATIVE
Urobilinogen, UA: 0.2 (ref 0.0–1.0)
pH: 7 (ref 5.0–8.0)

## 2021-11-01 LAB — LIPID PANEL
Cholesterol: 164 mg/dL (ref 0–200)
HDL: 66.5 mg/dL (ref >39.00)
LDL Cholesterol: 85 mg/dL (ref 0–99)
NonHDL: 97.27
Total CHOL/HDL Ratio: 2
Triglycerides: 59 mg/dL (ref 0.0–149.0)
VLDL: 11.8 mg/dL (ref 0.0–40.0)

## 2021-11-01 LAB — COMPREHENSIVE METABOLIC PANEL
ALT: 15 U/L (ref 0–53)
AST: 14 U/L (ref 0–37)
Albumin: 4.2 g/dL (ref 3.5–5.2)
Alkaline Phosphatase: 68 U/L (ref 39–117)
BUN: 14 mg/dL (ref 6–23)
CO2: 29 mEq/L (ref 19–32)
Calcium: 9.2 mg/dL (ref 8.4–10.5)
Chloride: 103 mEq/L (ref 96–112)
Creatinine, Ser: 1.23 mg/dL (ref 0.40–1.50)
GFR: 61.2 mL/min (ref >60.00)
Glucose, Bld: 96 mg/dL (ref 70–99)
Potassium: 4.5 mEq/L (ref 3.5–5.1)
Sodium: 138 mEq/L (ref 135–145)
Total Bilirubin: 0.5 mg/dL (ref 0.2–1.2)
Total Protein: 6.9 g/dL (ref 6.0–8.3)

## 2021-11-01 LAB — PSA: PSA: 0.99 ng/mL (ref 0.10–4.00)

## 2021-11-01 LAB — VITAMIN D 25 HYDROXY (VIT D DEFICIENCY, FRACTURES): VITD: 35.15 ng/mL (ref 30.00–100.00)

## 2021-11-01 LAB — TSH: TSH: 1.48 u[IU]/mL (ref 0.35–5.50)

## 2021-11-01 NOTE — Progress Notes (Signed)
? ?Established Patient Office Visit ? ?Subjective   ?Patient ID: Joseph Jennings, male    DOB: 11/10/1955  Age: 66 y.o. MRN: [id number] ? ?Chief Complaint  ?Patient presents with  ? Annual Exam  ?  CPE, no concerns. Patient fasting.   ? ? ?HPI for yearly physical exam.  He has been doing well.  Continues an active lifestyle with gardening and yoga 3 times a week.  He has regular dental care.  Up-to-date on his colonoscopies.  Has not been supplementing with vitamin D. ? ? ? ?Review of Systems  ?Constitutional: Negative.   ?HENT: Negative.    ?Eyes:  Negative for blurred vision, discharge and redness.  ?Respiratory: Negative.    ?Cardiovascular: Negative.   ?Gastrointestinal:  Negative for abdominal pain.  ?Genitourinary: Negative.  Negative for dysuria, frequency and hematuria.  ?Musculoskeletal: Negative.  Negative for myalgias.  ?Skin:  Negative for rash.  ?Neurological:  Negative for tingling, loss of consciousness and weakness.  ?Endo/Heme/Allergies:  Negative for polydipsia.  ? ?  ?Objective:  ?  ? ?BP 122/76 (BP Location: Right Arm, Patient Position: Sitting, Cuff Size: Normal)   Pulse 62   Temp 97.6 ?F (36.4 ?C) (Temporal)   Ht '6\' 2"'$  (1.88 m)   Wt 195 lb 9.6 oz (88.7 kg)   SpO2 96%   BMI 25.11 kg/m?  ?BP Readings from Last 3 Encounters:  ?11/01/21 122/76  ?10/27/20 122/76  ? ?Wt Readings from Last 3 Encounters:  ?11/01/21 195 lb 9.6 oz (88.7 kg)  ?11/21/20 190 lb (86.2 kg)  ?10/27/20 197 lb (89.4 kg)  ? ?  ? ?Physical Exam ?Constitutional:   ?   General: He is not in acute distress. ?   Appearance: Normal appearance. He is not ill-appearing, toxic-appearing or diaphoretic.  ?HENT:  ?   Head: Normocephalic and atraumatic.  ?   Right Ear: External ear normal.  ?   Left Ear: External ear normal.  ?   Mouth/Throat:  ?   Mouth: Mucous membranes are moist.  ?   Pharynx: Oropharynx is clear. No oropharyngeal exudate or posterior oropharyngeal erythema.  ?Eyes:  ?   General: No scleral icterus.    ?   Right  eye: No discharge.     ?   Left eye: No discharge.  ?   Extraocular Movements: Extraocular movements intact.  ?   Conjunctiva/sclera: Conjunctivae normal.  ?   Pupils: Pupils are equal, round, and reactive to light.  ?Neck:  ?   Thyroid: Thyroid mass present.  ?   Comments: Enlargement of right lobe of thyroid versus nodule.  Thyroid ultrasound for evaluation of thyroid enlargement versus mass.  Evaluation continue healthy lifestyle.  Note for IDT.  Here for physical here for physical and fasting labs were ordered. ?Cardiovascular:  ?   Rate and Rhythm: Normal rate and regular rhythm.  ?Pulmonary:  ?   Effort: Pulmonary effort is normal. No respiratory distress.  ?   Breath sounds: Normal breath sounds.  ?Abdominal:  ?   General: Bowel sounds are normal.  ?   Tenderness: There is no abdominal tenderness. There is no guarding.  ?Genitourinary: ?   Prostate: Enlarged. Not tender and no nodules present.  ?   Rectum: Guaiac result negative. No mass, tenderness, anal fissure, external hemorrhoid or internal hemorrhoid. Normal anal tone.  ?Musculoskeletal:  ?   Cervical back: No rigidity or tenderness.  ?Skin: ?   General: Skin is warm and dry.  ?Neurological:  ?  Mental Status: He is alert and oriented to person, place, and time.  ?Psychiatric:     ?   Mood and Affect: Mood normal.     ?   Behavior: Behavior normal.  ? ? ? ?No results found for any visits on 11/01/21. ? ? ? ?The 10-year ASCVD risk score (Arnett DK, et al., 2019) is: 9.6% ? ?  ?Assessment & Plan:  ? ?Problem List Items Addressed This Visit   ? ?  ? Endocrine  ? Thyroid nodule  ? Relevant Orders  ? TSH  ? US THYROID  ?  ? Other  ? Vitamin D deficiency  ? Relevant Orders  ? VITAMIN D 25 Hydroxy (Vit-D Deficiency, Fractures)  ? Encounter for hepatitis C screening test for low risk patient - Primary  ? Relevant Orders  ? Hepatitis C antibody  ? Leukopenia  ? Relevant Orders  ? CBC w/Diff  ? Need for pneumococcal 20-valent conjugate vaccination  ? Relevant  Orders  ? Pneumococcal conjugate vaccine 20-valent (Completed)  ? ? ?Return in about 6 months (around 05/04/2022), or if symptoms worsen or fail to improve.  ?Information given about health maintenance and disease prevention.  Ultrasound ordered forevaluation of thyroid mass vs. enlargemnt.   ? ?Libby Maw, MD ? ?Note for IT.Here for pe and fasting labs ordered that did not cross over. ? ?

## 2021-11-02 LAB — HEPATITIS C ANTIBODY
Hepatitis C Ab: NONREACTIVE
SIGNAL TO CUT-OFF: 0.1 (ref <1.00)

## 2021-11-06 ENCOUNTER — Ambulatory Visit
Admission: RE | Admit: 2021-11-06 | Discharge: 2021-11-06 | Disposition: A | Discharge status: Home / Self Care | Payer: Medicare Other | Source: Ambulatory Visit | Attending: Family Medicine | Admitting: Family Medicine

## 2021-11-06 DIAGNOSIS — E041 Nontoxic single thyroid nodule: Secondary | ICD-10-CM

## 2021-12-08 ENCOUNTER — Other Ambulatory Visit: Payer: Self-pay | Admitting: Family Medicine

## 2021-12-08 DIAGNOSIS — N5201 Erectile dysfunction due to arterial insufficiency: Secondary | ICD-10-CM

## 2021-12-29 ENCOUNTER — Encounter: Payer: Self-pay | Admitting: Family Medicine

## 2021-12-29 ENCOUNTER — Telehealth (INDEPENDENT_AMBULATORY_CARE_PROVIDER_SITE_OTHER): Payer: Medicare Other | Admitting: Family Medicine

## 2021-12-29 DIAGNOSIS — U071 COVID-19: Secondary | ICD-10-CM | POA: Diagnosis not present

## 2021-12-29 MED ORDER — PREDNISONE 10 MG PO TABS: 10.0000 mg | ORAL_TABLET | Freq: Two times a day (BID) | ORAL | 0 refills | Status: AC

## 2021-12-29 MED ORDER — NIRMATRELVIR/RITONAVIR (PAXLOVID)TABLET: 3.0000 | ORAL_TABLET | Freq: Two times a day (BID) | ORAL | 0 refills | Status: AC

## 2021-12-29 NOTE — Progress Notes (Signed)
Established Patient Office Visit  Subjective   Patient ID: Joseph Jennings, male    DOB: 22-Dec-1955  Age: 66 y.o. MRN: [id number]  Chief Complaint  Patient presents with   Covid Positive    Covid positive this morning. Cough, fever and low grade temp x 2 days.     HPI 1 day history of elevated temperature nasal congestion and cough with increased fatigue.  Denies wheezing or shortness of breath.  Asthma is well controlled with Advair.  Denies headache sore throat or body aches and pains.  Tested positive for COVID this morning.  He has completed his vaccination series    Review of Systems  Constitutional:  Positive for malaise/fatigue.  HENT:  Positive for congestion.   Eyes:  Negative for blurred vision, discharge and redness.  Respiratory:  Positive for cough. Negative for sputum production, shortness of breath and wheezing.   Cardiovascular: Negative.   Gastrointestinal:  Negative for abdominal pain.  Genitourinary: Negative.   Musculoskeletal: Negative.  Negative for joint pain and myalgias.  Skin:  Negative for rash.  Neurological:  Negative for tingling, loss of consciousness, weakness and headaches.  Endo/Heme/Allergies:  Negative for polydipsia.      Objective:     There were no vitals taken for this visit.   Physical Exam Constitutional:      General: He is not in acute distress.    Appearance: Normal appearance. He is not ill-appearing, toxic-appearing or diaphoretic.  HENT:     Head: Normocephalic and atraumatic.     Right Ear: External ear normal.     Left Ear: External ear normal.  Eyes:     General: No scleral icterus.       Right eye: No discharge.        Left eye: No discharge.     Extraocular Movements: Extraocular movements intact.     Conjunctiva/sclera: Conjunctivae normal.  Pulmonary:     Effort: Pulmonary effort is normal. No respiratory distress.  Musculoskeletal:     Cervical back: No rigidity or tenderness.  Skin:    General: Skin is  warm and dry.  Neurological:     Mental Status: He is alert and oriented to person, place, and time.  Psychiatric:        Mood and Affect: Mood normal.        Behavior: Behavior normal.      No results found for any visits on 12/29/21.    The 10-year ASCVD risk score (Arnett DK, et al., 2019) is: 9.5%    Assessment & Plan:   Problem List Items Addressed This Visit       Other   COVID-19 - Primary   Relevant Medications   nirmatrelvir/ritonavir EUA (PAXLOVID) 20 x 150 MG & 10 x '100MG'$  TABS   predniSONE (DELTASONE) 10 MG tablet    No follow-ups on file.  We will start Paxlovid and prednisone 10 twice daily.  He will hold his Advair because of potential interaction with salmeterol until he finishes the Paxlovid.  Follow-up in 1 week if not improving.  Quarantine for 5 days  Libby Maw, MD  Virtual Visit via Video Note  I connected with Joseph Jennings on 12/29/21 at 10:40 AM EDT by a video enabled telemedicine application and verified that I am speaking with the correct person using two identifiers.  Location: Patient: alone in a room at home.  Provider: work   I discussed the limitations of evaluation and management by telemedicine and  the availability of in person appointments. The patient expressed understanding and agreed to proceed.  History of Present Illness:    Observations/Objective:   Assessment and Plan:   Follow Up Instructions:    I discussed the assessment and treatment plan with the patient. The patient was provided an opportunity to ask questions and all were answered. The patient agreed with the plan and demonstrated an understanding of the instructions.   The patient was advised to call back or seek an in-person evaluation if the symptoms worsen or if the condition fails to improve as anticipated.  I provided 20 minutes of non-face-to-face time during this encounter.   Libby Maw, MD

## 2022-01-01 ENCOUNTER — Telehealth: Payer: Medicare Other | Admitting: Family Medicine

## 2022-06-04 ENCOUNTER — Ambulatory Visit (INDEPENDENT_AMBULATORY_CARE_PROVIDER_SITE_OTHER): Payer: Medicare Other

## 2022-06-04 VITALS — BP 120/78 | HR 70 | Temp 97.9°F | Ht 74.5 in | Wt 200.4 lb

## 2022-06-04 DIAGNOSIS — Z0000 Encounter for general adult medical examination without abnormal findings: Secondary | ICD-10-CM | POA: Diagnosis not present

## 2022-06-04 NOTE — Progress Notes (Signed)
Subjective:   Joseph Jennings is a 66 y.o. male who presents for Medicare Annual/Subsequent preventive examination.  Review of Systems     Cardiac Risk Factors include: advanced age (>14mn, >>56women);male gender     Objective:    Today's Vitals   06/04/22 1106  BP: 120/78  Pulse: 70  Temp: 97.9 F (36.6 C)  TempSrc: Oral  SpO2: 96%  Weight: 200 lb 6.4 oz (90.9 kg)  Height: 6' 2.5" (1.892 m)   Body mass index is 25.39 kg/m.     06/04/2022   11:13 AM 05/31/2021    9:53 AM  Advanced Directives  Does Patient Have a Medical Advance Directive? Yes Yes  Type of AParamedicof A[place]intstoneLiving will H[place]vaLiving will  Copy of HColein Chart? No - copy requested No - copy requested    Current Medications (verified) Outpatient Encounter Medications as of 06/04/2022  Medication Sig   albuterol (VENTOLIN HFA) 108 (90 Base) MCG/ACT inhaler Inhale 1-2 puffs into the lungs every 6 (six) hours as needed for wheezing or shortness of breath.   fluticasone-salmeterol (ADVAIR) 100-50 MCG/ACT AEPB INHALE 1 PUFF EVERY 12 HOURS   Quercetin 50 MG TABS 1 tablet   sildenafil (REVATIO) 20 MG tablet TAKE 3 TO 5 TABLETS BY MOUTH 45 MINUTES PRIOR TO INTERCOURSE   No facility-administered encounter medications on file as of 06/04/2022.    Allergies (verified) Cetirizine and Other   History: Past Medical History:  Diagnosis Date   Asthma    Past Surgical History:  Procedure Laterality Date   HIP SURGERY     Family History  Problem Relation Age of Onset   Hypertension Mother    Social History   Socioeconomic History   Marital status: Married    Spouse name: Not on file   Number of children: Not on file   Years of education: Not on file   Highest education level: Not on file  Occupational History   Not on file  Tobacco Use   Smoking status: Never   Smokeless tobacco: Never  Vaping Use   Vaping Use: Never  used  Substance and Sexual Activity   Alcohol use: Yes    Alcohol/week: 2.0 standard drinks of alcohol    Types: 1 Glasses of wine, 1 Cans of beer per week    Comment: social   Drug use: Never   Sexual activity: Yes  Other Topics Concern   Not on file  Social History Narrative   Not on file   Social Determinants of Health   Financial Resource Strain: Low Risk  (06/04/2022)   Overall Financial Resource Strain (CARDIA)    Difficulty of Paying Living Expenses: Not hard at all  Food Insecurity: No Food Insecurity (06/04/2022)   Hunger Vital Sign    Worried About Running Out of Food in the Last Year: Never true    Ran Out of Food in the Last Year: Never true  Transportation Needs: No Transportation Needs (06/04/2022)   PRAPARE - THydrologist(Medical): No    Lack of Transportation (Non-Medical): No  Physical Activity: Sufficiently Active (06/04/2022)   Exercise Vital Sign    Days of Exercise per Week: 3 days    Minutes of Exercise per Session: 60 min  Stress: No Stress Concern Present (06/04/2022)   [place]   Feeling of Stress : Not at all  Social Connections: Socially Integrated (05/31/2021)   Social Connection and Isolation Panel [NHANES]    Frequency of Communication with Friends and Family: Twice a week    Frequency of Social Gatherings with Friends and Family: Twice a week    Attends Religious Services: More than 4 times per year    Active Member of [employer] or Organizations: Yes    Attends Music therapist: More than 4 times per year    Marital Status: Married    Tobacco Counseling Counseling given: Not Answered   Clinical Intake:  Pre-visit preparation completed: Yes  Pain : No/denies pain     Nutritional Status: BMI 25 -29 Overweight Nutritional Risks: None Diabetes: No  How often do you need to have someone help you when you read instructions,  pamphlets, or other written materials from your doctor or pharmacy?: 1 - Never  Diabetic? no  Interpreter Needed?: No  Information entered by :: NAllen LPN   Activities of Daily Living    06/04/2022   11:15 AM  In your present state of health, do you have any difficulty performing the following activities:  Hearing? 0  Vision? 0  Difficulty concentrating or making decisions? 0  Walking or climbing stairs? 0  Dressing or bathing? 0  Doing errands, shopping? 0  Preparing Food and eating ? N  Using the Toilet? N  In the past six months, have you accidently leaked urine? N  Do you have problems with loss of bowel control? N  Managing your Medications? N  Managing your Finances? N  Housekeeping or managing your Housekeeping? N    Patient Care Team: Libby Maw, MD as PCP - General (Family Medicine)  Indicate any recent Medical Services you may have received from other than [facility] providers in the past year (date may be approximate).     Assessment:   This is a routine wellness examination for Cy.  Hearing/Vision screen Vision Screening - Comments:: Regular eye exams, Dr. Prudencio Burly  Dietary issues and exercise activities discussed: Current Exercise Habits: Structured exercise class, Type of exercise: yoga;walking, Time (Minutes): 60, Frequency (Times/Week): 3, Weekly Exercise (Minutes/Week): 180   Goals Addressed             This Visit's Progress    Patient Stated       06/04/2022, wants to lose some weight       Depression Screen    06/04/2022   11:15 AM 12/29/2021   10:49 AM 11/01/2021    9:02 AM 05/31/2021   10:01 AM 05/31/2021    9:53 AM 05/31/2021    9:51 AM 10/27/2020    9:10 AM  PHQ 2/9 Scores  PHQ - 2 Score 0 0 0 0 0 0 0    Fall Risk    06/04/2022   11:15 AM 12/29/2021   10:49 AM 11/01/2021    9:02 AM 05/31/2021    9:55 AM 05/31/2021    9:53 AM  Fall Risk   Falls in the past year? 0 0 0 0 0  Number falls in past yr: 0 0 0 0 0   Injury with Fall? 0   0 0  Risk for fall due to : Medication side effect      Follow up Falls prevention discussed;Education provided;Falls evaluation completed   Falls evaluation completed Falls evaluation completed    FALL RISK PREVENTION PERTAINING TO THE HOME:  Any stairs in or around the home? Yes  If so, are there any without handrails? No  Home free of loose throw rugs in walkways, pet beds, electrical cords, etc? Yes  Adequate lighting in your home to reduce risk of falls? Yes   ASSISTIVE DEVICES UTILIZED TO PREVENT FALLS:  Life alert? No  Use of a cane, walker or w/c? No  Grab bars in the bathroom? No  Shower chair or bench in shower? No  Elevated toilet seat or a handicapped toilet? No   TIMED UP AND GO:  Was the test performed? Yes .  Length of time to ambulate 10 feet: 5 sec.   Gait steady and fast without use of assistive device  Cognitive Function:        06/04/2022   11:16 AM  6CIT Screen  What Year? 0 points  What month? 0 points  What time? 0 points  Count back from 20 0 points  Months in reverse 0 points  Repeat phrase 0 points  Total Score 0 points    Immunizations Immunization History  Administered Date(s) Administered   Hepatitis A, Adult 07/03/2018, 10/02/2019   Influenza Split 03/27/2012, 02/18/2014, 03/28/2016, 04/20/2017, 03/29/2018, 04/04/2019   Influenza,inj,Quad PF,6+ Mos 04/18/2018   Influenza,inj,quad, With Preservative 03/17/2015   PFIZER(Purple Top)SARS-COV-2 Vaccination 08/28/2019, 09/18/2019, 05/29/2020   PNEUMOCOCCAL CONJUGATE-20 11/01/2021   Tdap 02/12/2012, 07/03/2018    TDAP status: Up to date  Flu Vaccine status: Up to date  Pneumococcal vaccine status: Up to date  Covid-19 vaccine status: Completed vaccines  Qualifies for Shingles Vaccine? Yes   Zostavax completed No   Shingrix Completed?: No.    Education has been provided regarding the importance of this vaccine. Patient has been advised to call insurance  company to determine out of pocket expense if they have not yet received this vaccine. Advised may also receive vaccine at local pharmacy or Health Dept. Verbalized acceptance and understanding.  Screening Tests Health Maintenance  Topic Date Due   Zoster Vaccines- Shingrix (1 of 2) Never done   INFLUENZA VACCINE  01/16/2022   Medicare Annual Wellness (AWV)  06/05/2023   COLONOSCOPY (Pts 45-69yr Insurance coverage will need to be confirmed)  04/16/2024   DTaP/Tdap/Td (3 - Td or Tdap) 07/03/2028   Pneumonia Vaccine 67 Years old  Completed   Hepatitis C Screening  Completed   HPV VACCINES  Aged Out   COVID-19 Vaccine  Discontinued    Health Maintenance  Health Maintenance Due  Topic Date Due   Zoster Vaccines- Shingrix (1 of 2) Never done   INFLUENZA VACCINE  01/16/2022    Colorectal cancer screening: Type of screening: Colonoscopy. Completed 04/16/2014. Repeat every 10 years  Lung Cancer Screening: (Low Dose CT Chest recommended if Age 67-80years, 30 pack-year currently smoking OR have quit w/in 15years.) does not qualify.   Lung Cancer Screening Referral: no  Additional Screening:  Hepatitis C Screening: does qualify; Completed 11/01/2021  Vision Screening: Recommended annual ophthalmology exams for early detection of glaucoma and other disorders of the eye. Is the patient up to date with their annual eye exam?  Yes  Who is the provider or what is the name of the office in which the patient attends annual eye exams? Dr. LPrudencio BurlyIf pt is not established with a provider, would they like to be referred to a provider to establish care? No .   Dental Screening: Recommended annual dental exams for proper oral hygiene  Community Resource Referral / Chronic Care Management: CRR required this visit?  No   CCM required this visit?  No  Plan:     I have personally reviewed and noted the following in the patient's chart:   Medical and social history Use of alcohol,  tobacco or illicit drugs  Current medications and supplements including opioid prescriptions. Patient is not currently taking opioid prescriptions. Functional ability and status Nutritional status Physical activity Advanced directives List of other physicians Hospitalizations, surgeries, and ER visits in previous 12 months Vitals Screenings to include cognitive, depression, and falls Referrals and appointments  In addition, I have reviewed and discussed with patient certain preventive protocols, quality metrics, and best practice recommendations. A written personalized care plan for preventive services as well as general preventive health recommendations were provided to patient.     Kellie Simmering, LPN   [id number]   Nurse Notes: none

## 2022-06-04 NOTE — Patient Instructions (Addendum)
Mr. Joseph Jennings , Thank you for taking time to come for your Medicare Wellness Visit. I appreciate your ongoing commitment to your health goals. Please review the following plan we discussed and let me know if I can assist you in the future.   These are the goals we discussed:  Goals      Patient Stated     06/04/2022, wants to lose some weight        This is a list of the screening recommended for you and due dates:  Health Maintenance  Topic Date Due   Zoster (Shingles) Vaccine (1 of 2) Never done   Medicare Annual Wellness Visit  06/05/2023   Colon Cancer Screening  04/16/2024   DTaP/Tdap/Td vaccine (3 - Td or Tdap) 07/03/2028   Pneumonia Vaccine  Completed   Flu Shot  Completed   Hepatitis C Screening: USPSTF Recommendation to screen - Ages 18-79 yo.  Completed   HPV Vaccine  Aged Out   COVID-19 Vaccine  Discontinued    Advanced directives: Please bring a copy of your POA (Power of [place]) and/or Living Will to your next appointment.   Conditions/risks identified: none  Next appointment: Follow up in one year for your annual wellness visit.   Preventive Care 16 Years and Older, Male  Preventive care refers to lifestyle choices and visits with your health care provider that can promote health and wellness. What does preventive care include? A yearly physical exam. This is also called an annual well check. Dental exams once or twice a year. Routine eye exams. Ask your health care provider how often you should have your eyes checked. Personal lifestyle choices, including: Daily care of your teeth and gums. Regular physical activity. Eating a healthy diet. Avoiding tobacco and drug use. Limiting alcohol use. Practicing safe sex. Taking low doses of aspirin every day. Taking vitamin and mineral supplements as recommended by your health care provider. What happens during an annual well check? The services and screenings done by your health care provider during your annual  well check will depend on your age, overall health, lifestyle risk factors, and family history of disease. Counseling  Your health care provider may ask you questions about your: Alcohol use. Tobacco use. Drug use. Emotional well-being. Home and relationship well-being. Sexual activity. Eating habits. History of falls. Memory and ability to understand (cognition). Work and work Statistician. Screening  You may have the following tests or measurements: Height, weight, and BMI. Blood pressure. Lipid and cholesterol levels. These may be checked every 5 years, or more frequently if you are over 42 years old. Skin check. Lung cancer screening. You may have this screening every year starting at age 15 if you have a 30-pack-year history of smoking and currently smoke or have quit within the past 15 years. Fecal occult blood test (FOBT) of the stool. You may have this test every year starting at age 79. Flexible sigmoidoscopy or colonoscopy. You may have a sigmoidoscopy every 5 years or a colonoscopy every 10 years starting at age 94. Prostate cancer screening. Recommendations will vary depending on your family history and other risks. Hepatitis C blood test. Hepatitis B blood test. Sexually transmitted disease (STD) testing. Diabetes screening. This is done by checking your blood sugar (glucose) after you have not eaten for a while (fasting). You may have this done every 1-3 years. Abdominal aortic aneurysm (AAA) screening. You may need this if you are a current or former smoker. Osteoporosis. You may be screened starting at  age 85 if you are at high risk. Talk with your health care provider about your test results, treatment options, and if necessary, the need for more tests. Vaccines  Your health care provider may recommend certain vaccines, such as: Influenza vaccine. This is recommended every year. Tetanus, diphtheria, and acellular pertussis (Tdap, Td) vaccine. You may need a Td booster  every 10 years. Zoster vaccine. You may need this after age 73. Pneumococcal 13-valent conjugate (PCV13) vaccine. One dose is recommended after age 76. Pneumococcal polysaccharide (PPSV23) vaccine. One dose is recommended after age 58. Talk to your health care provider about which screenings and vaccines you need and how often you need them. This information is not intended to replace advice given to you by your health care provider. Make sure you discuss any questions you have with your health care provider. Document Released: 07/01/2015 Document Revised: 02/22/2016 Document Reviewed: 04/05/2015 Elsevier Interactive Patient Education  2017 [place] Prevention in the Home Falls can cause injuries. They can happen to people of all ages. There are many things you can do to make your home safe and to help prevent falls. What can I do on the outside of my home? Regularly fix the edges of walkways and driveways and fix any cracks. Remove anything that might make you trip as you walk through a door, such as a raised step or threshold. Trim any bushes or trees on the path to your home. Use bright outdoor lighting. Clear any walking paths of anything that might make someone trip, such as rocks or tools. Regularly check to see if handrails are loose or broken. Make sure that both sides of any steps have handrails. Any raised decks and porches should have guardrails on the edges. Have any leaves, snow, or ice cleared regularly. Use sand or salt on walking paths during winter. Clean up any spills in your garage right away. This includes oil or grease spills. What can I do in the bathroom? Use night lights. Install grab bars by the toilet and in the tub and shower. Do not use towel bars as grab bars. Use non-skid mats or decals in the tub or shower. If you need to sit down in the shower, use a plastic, non-slip stool. Keep the floor dry. Clean up any water that spills on the floor as soon  as it happens. Remove soap buildup in the tub or shower regularly. Attach bath mats securely with double-sided non-slip rug tape. Do not have throw rugs and other things on the floor that can make you trip. What can I do in the bedroom? Use night lights. Make sure that you have a light by your bed that is easy to reach. Do not use any sheets or blankets that are too big for your bed. They should not hang down onto the floor. Have a firm chair that has side arms. You can use this for support while you get dressed. Do not have throw rugs and other things on the floor that can make you trip. What can I do in the kitchen? Clean up any spills right away. Avoid walking on wet floors. Keep items that you use a lot in easy-to-reach places. If you need to reach something above you, use a strong step stool that has a grab bar. Keep electrical cords out of the way. Do not use floor polish or wax that makes floors slippery. If you must use wax, use non-skid floor wax. Do not have throw rugs and  other things on the floor that can make you trip. What can I do with my stairs? Do not leave any items on the stairs. Make sure that there are handrails on both sides of the stairs and use them. Fix handrails that are broken or loose. Make sure that handrails are as long as the stairways. Check any carpeting to make sure that it is firmly attached to the stairs. Fix any carpet that is loose or worn. Avoid having throw rugs at the top or bottom of the stairs. If you do have throw rugs, attach them to the floor with carpet tape. Make sure that you have a light switch at the top of the stairs and the bottom of the stairs. If you do not have them, ask someone to add them for you. What else can I do to help prevent falls? Wear shoes that: Do not have high heels. Have rubber bottoms. Are comfortable and fit you well. Are closed at the toe. Do not wear sandals. If you use a stepladder: Make sure that it is fully  opened. Do not climb a closed stepladder. Make sure that both sides of the stepladder are locked into place. Ask someone to hold it for you, if possible. Clearly mark and make sure that you can see: Any grab bars or handrails. First and last steps. Where the edge of each step is. Use tools that help you move around (mobility aids) if they are needed. These include: Canes. Walkers. Scooters. Crutches. Turn on the lights when you go into a dark area. Replace any light bulbs as soon as they burn out. Set up your furniture so you have a clear path. Avoid moving your furniture around. If any of your floors are uneven, fix them. If there are any pets around you, be aware of where they are. Review your medicines with your doctor. Some medicines can make you feel dizzy. This can increase your chance of falling. Ask your doctor what other things that you can do to help prevent falls. This information is not intended to replace advice given to you by your health care provider. Make sure you discuss any questions you have with your health care provider. Document Released: 03/31/2009 Document Revised: 11/10/2015 Document Reviewed: 07/09/2014 Elsevier Interactive Patient Education  2017 [employer].

## 2022-08-12 ENCOUNTER — Encounter: Payer: Self-pay | Admitting: Family Medicine

## 2022-08-24 ENCOUNTER — Encounter: Payer: Self-pay | Admitting: Family Medicine

## 2022-08-24 ENCOUNTER — Ambulatory Visit (INDEPENDENT_AMBULATORY_CARE_PROVIDER_SITE_OTHER): Payer: Medicare Other | Admitting: Family Medicine

## 2022-08-24 VITALS — BP 138/84 | HR 62 | Temp 98.1°F | Wt 198.6 lb

## 2022-08-24 DIAGNOSIS — R030 Elevated blood-pressure reading, without diagnosis of hypertension: Secondary | ICD-10-CM

## 2022-08-24 NOTE — Progress Notes (Signed)
Established Patient Office Visit   Subjective:  Patient ID: Joseph Jennings, male    DOB: 1956/03/14  Age: 67 y.o. MRN: [id number]  Chief Complaint  Patient presents with   B/P concerns    145/90 at home    HPI Encounter Diagnoses  Name Primary?   Elevated BP without diagnosis of hypertension Yes   Blood pressure has been running higher than usual over the last month or so.  First noted when he went to donate blood.  At home with a brachial cuff he has been measuring pressures in the 140s over 80s.  He denies headaches blurred vision chest pain.  He does yoga 3 times a week and otherwise uses a rowing machine.  No added salt.  He is careful to avoid packaged foods or consuming fast foods.  Rarely drinks alcohol.  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  Things are well at home.   Review of Systems  Constitutional: Negative.   HENT: Negative.    Eyes:  Negative for blurred vision, discharge and redness.  Respiratory: Negative.    Cardiovascular: Negative.  Negative for chest pain.  Gastrointestinal:  Negative for abdominal pain.  Genitourinary: Negative.   Musculoskeletal: Negative.  Negative for myalgias.  Skin:  Negative for rash.  Neurological:  Negative for tingling, loss of consciousness, weakness and headaches.  Endo/Heme/Allergies:  Negative for polydipsia.     Current Outpatient Medications:    albuterol (VENTOLIN HFA) 108 (90 Base) MCG/ACT inhaler, Inhale 1-2 puffs into the lungs every 6 (six) hours as needed for wheezing or shortness of breath., Disp: 1 each, Rfl: 0   fluticasone-salmeterol (ADVAIR) 100-50 MCG/ACT AEPB, INHALE 1 PUFF EVERY 12 HOURS, Disp: 3 each, Rfl: 2   Quercetin 50 MG TABS, 1 tablet, Disp: , Rfl:    sildenafil (REVATIO) 20 MG tablet, TAKE 3 TO 5 TABLETS BY MOUTH 45 MINUTES PRIOR TO INTERCOURSE, Disp: 25 tablet, Rfl: 2   Objective:     BP 138/84 (BP Location: Left Arm, Patient Position: Sitting, Cuff  Size: Large)   Pulse 62   Temp 98.1 F (36.7 C) (Temporal)   Wt 198 lb 9.6 oz (90.1 kg)   SpO2 97%   BMI 25.16 kg/m  BP Readings from Last 3 Encounters:  08/24/22 138/84  06/04/22 120/78  11/01/21 122/76   Wt Readings from Last 3 Encounters:  08/24/22 198 lb 9.6 oz (90.1 kg)  06/04/22 200 lb 6.4 oz (90.9 kg)  11/01/21 195 lb 9.6 oz (88.7 kg)      Physical Exam Constitutional:      General: He is not in acute distress.    Appearance: Normal appearance. He is not ill-appearing, toxic-appearing or diaphoretic.  HENT:     Head: Normocephalic and atraumatic.     Right Ear: External ear normal.     Left Ear: External ear normal.  Eyes:     General: No scleral icterus.       Right eye: No discharge.        Left eye: No discharge.     Extraocular Movements: Extraocular movements intact.     Conjunctiva/sclera: Conjunctivae normal.  Pulmonary:     Effort: Pulmonary effort is normal. No respiratory distress.  Skin:    General: Skin is warm and dry.  Neurological:     Mental Status: He is alert and oriented to person, place, and time.  Psychiatric:        Mood and  Affect: Mood normal.        Behavior: Behavior normal.      No results found for any visits on 08/24/22.    The 10-year ASCVD risk score (Arnett DK, et al., 2019) is: 12.8%    Assessment & Plan:   Elevated BP without diagnosis of hypertension    Return Follow-up in May for physical..  Will bring in his BP cuff for physical in May.  Continue exercising.  Avoid sodium.  BMI of 25 he is not an ideal weight but losing a pound or 2 might bring his pressure down some.  We discussed that a BP of less than 150/90 would be acceptable in his case.  Libby Maw, MD

## 2022-10-20 ENCOUNTER — Encounter: Payer: Self-pay | Admitting: Family Medicine

## 2022-10-22 ENCOUNTER — Other Ambulatory Visit: Payer: Self-pay

## 2022-10-22 DIAGNOSIS — J4520 Mild intermittent asthma, uncomplicated: Secondary | ICD-10-CM

## 2022-10-22 MED ORDER — FLUTICASONE-SALMETEROL 100-50 MCG/ACT IN AEPB: INHALATION_SPRAY | RESPIRATORY_TRACT | 2 refills | Status: DC

## 2022-10-22 MED ORDER — FLUTICASONE-SALMETEROL 250-50 MCG/ACT IN AEPB: 1.0000 | INHALATION_SPRAY | Freq: Two times a day (BID) | RESPIRATORY_TRACT | 1 refills | Status: DC

## 2022-10-22 MED ORDER — FLUTICASONE-SALMETEROL 100-50 MCG/ACT IN AEPB: 1.0000 | INHALATION_SPRAY | Freq: Two times a day (BID) | RESPIRATORY_TRACT | 0 refills | Status: DC

## 2022-10-22 MED ORDER — FLUTICASONE-SALMETEROL 250-50 MCG/ACT IN AEPB: 1.0000 | INHALATION_SPRAY | Freq: Two times a day (BID) | RESPIRATORY_TRACT | 5 refills | Status: DC

## 2022-10-22 MED ORDER — FLUTICASONE-SALMETEROL 100-50 MCG/ACT IN AEPB: 1.0000 | INHALATION_SPRAY | Freq: Two times a day (BID) | RESPIRATORY_TRACT | 2 refills | Status: DC

## 2022-10-22 NOTE — Addendum Note (Signed)
Addended by: Andrez Grime on: 10/22/2022 09:23 AM   Modules accepted: Orders

## 2022-10-25 ENCOUNTER — Other Ambulatory Visit: Payer: Self-pay

## 2022-10-25 DIAGNOSIS — J4520 Mild intermittent asthma, uncomplicated: Secondary | ICD-10-CM

## 2022-10-25 MED ORDER — FLUTICASONE-SALMETEROL 250-50 MCG/ACT IN AEPB: 1.0000 | INHALATION_SPRAY | Freq: Two times a day (BID) | RESPIRATORY_TRACT | 1 refills | Status: DC

## 2022-11-05 ENCOUNTER — Other Ambulatory Visit: Payer: Self-pay

## 2022-11-05 ENCOUNTER — Encounter: Payer: Self-pay | Admitting: Family Medicine

## 2022-11-05 ENCOUNTER — Ambulatory Visit (INDEPENDENT_AMBULATORY_CARE_PROVIDER_SITE_OTHER): Payer: Medicare Other | Admitting: Family Medicine

## 2022-11-05 VITALS — BP 120/72 | HR 57 | Temp 98.0°F | Ht 74.0 in | Wt 194.6 lb

## 2022-11-05 DIAGNOSIS — J4520 Mild intermittent asthma, uncomplicated: Secondary | ICD-10-CM

## 2022-11-05 DIAGNOSIS — D72819 Decreased white blood cell count, unspecified: Secondary | ICD-10-CM | POA: Diagnosis not present

## 2022-11-05 DIAGNOSIS — Z77018 Contact with and (suspected) exposure to other hazardous metals: Secondary | ICD-10-CM

## 2022-11-05 DIAGNOSIS — Z0000 Encounter for general adult medical examination without abnormal findings: Secondary | ICD-10-CM | POA: Diagnosis not present

## 2022-11-05 LAB — COMPREHENSIVE METABOLIC PANEL
ALT: 17 U/L (ref 0–53)
AST: 16 U/L (ref 0–37)
Albumin: 3.9 g/dL (ref 3.5–5.2)
Alkaline Phosphatase: 76 U/L (ref 39–117)
BUN: 17 mg/dL (ref 6–23)
CO2: 28 mEq/L (ref 19–32)
Calcium: 8.9 mg/dL (ref 8.4–10.5)
Chloride: 103 mEq/L (ref 96–112)
Creatinine, Ser: 1.03 mg/dL (ref 0.40–1.50)
GFR: 75.19 mL/min (ref >60.00)
Glucose, Bld: 91 mg/dL (ref 70–99)
Potassium: 4.5 mEq/L (ref 3.5–5.1)
Sodium: 139 mEq/L (ref 135–145)
Total Bilirubin: 0.4 mg/dL (ref 0.2–1.2)
Total Protein: 6.6 g/dL (ref 6.0–8.3)

## 2022-11-05 LAB — URINALYSIS, ROUTINE W REFLEX MICROSCOPIC
Bilirubin Urine: NEGATIVE
Hgb urine dipstick: NEGATIVE
Ketones, ur: NEGATIVE
Leukocytes,Ua: NEGATIVE
Nitrite: NEGATIVE
RBC / HPF: NONE SEEN (ref 0–?)
Specific Gravity, Urine: 1.01 (ref 1.000–1.030)
Total Protein, Urine: NEGATIVE
Urine Glucose: NEGATIVE
Urobilinogen, UA: 0.2 (ref 0.0–1.0)
pH: 6.5 (ref 5.0–8.0)

## 2022-11-05 LAB — CBC WITH DIFFERENTIAL/PLATELET
Basophils Absolute: 0 10*3/uL (ref 0.0–0.1)
Basophils Relative: 0.5 % (ref 0.0–3.0)
Eosinophils Absolute: 0.1 10*3/uL (ref 0.0–0.7)
Eosinophils Relative: 1.8 % (ref 0.0–5.0)
HCT: 45 % (ref 39.0–52.0)
Hemoglobin: 15.3 g/dL (ref 13.0–17.0)
Lymphocytes Relative: 34.3 % (ref 12.0–46.0)
Lymphs Abs: 1 10*3/uL (ref 0.7–4.0)
MCHC: 34.1 g/dL (ref 30.0–36.0)
MCV: 94.9 fl (ref 78.0–100.0)
Monocytes Absolute: 0.2 10*3/uL (ref 0.1–1.0)
Monocytes Relative: 8.5 % (ref 3.0–12.0)
Neutro Abs: 1.6 10*3/uL (ref 1.4–7.7)
Neutrophils Relative %: 54.9 % (ref 43.0–77.0)
Platelets: 188 10*3/uL (ref 150.0–400.0)
RBC: 4.74 Mil/uL (ref 4.22–5.81)
RDW: 12.6 % (ref 11.5–15.5)
WBC: 2.9 10*3/uL — ABNORMAL LOW (ref 4.0–10.5)

## 2022-11-05 LAB — LIPID PANEL
Cholesterol: 150 mg/dL (ref 0–200)
HDL: 48.7 mg/dL (ref >39.00)
LDL Cholesterol: 88 mg/dL (ref 0–99)
NonHDL: 101.71
Total CHOL/HDL Ratio: 3
Triglycerides: 71 mg/dL (ref 0.0–149.0)
VLDL: 14.2 mg/dL (ref 0.0–40.0)

## 2022-11-05 MED ORDER — FLUTICASONE-SALMETEROL 250-50 MCG/ACT IN AEPB: 1.0000 | INHALATION_SPRAY | Freq: Two times a day (BID) | RESPIRATORY_TRACT | 1 refills | Status: AC

## 2022-11-05 NOTE — Telephone Encounter (Signed)
OV requested prescription faxed

## 2022-11-05 NOTE — Progress Notes (Signed)
Established Patient Office Visit   Subjective:  Patient ID: Joseph Jennings, male    DOB: 1956/01/20  Age: 67 y.o. MRN: [id number]  Chief Complaint  Patient presents with   Medical Management of Chronic Issues    Routine follow up, no concerns. Patient fasting for labs.     HPI Encounter Diagnoses  Name Primary?   Healthcare maintenance Yes   Heavy metal exposure    Leukopenia, unspecified type    For yearly physical.  Doing well.  Remains active riding his bike, doing yoga working in his yard.  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  He lives at home with his wife.  Things are going well there.  He has regular dental care.  Up-to-date on health maintenance.  Status post hip replacement.  His orthopedic surgeon had expressed a concern about possible chromium exposure from the hip prosthesis.   Review of Systems  Constitutional: Negative.   HENT: Negative.    Eyes:  Negative for blurred vision, discharge and redness.  Respiratory: Negative.    Cardiovascular: Negative.   Gastrointestinal:  Negative for abdominal pain.  Genitourinary: Negative.   Musculoskeletal: Negative.  Negative for myalgias.  Skin:  Negative for rash.  Neurological:  Negative for tingling, loss of consciousness and weakness.  Endo/Heme/Allergies:  Negative for polydipsia.     Current Outpatient Medications:    albuterol (VENTOLIN HFA) 108 (90 Base) MCG/ACT inhaler, Inhale 1-2 puffs into the lungs every 6 (six) hours as needed for wheezing or shortness of breath., Disp: 1 each, Rfl: 0   fluticasone-salmeterol (ADVAIR DISKUS) 250-50 MCG/ACT AEPB, Inhale 1 puff into the lungs in the morning and at bedtime., Disp: 3 each, Rfl: 1   Quercetin 50 MG TABS, 1 tablet, Disp: , Rfl:    sildenafil (REVATIO) 20 MG tablet, TAKE 3 TO 5 TABLETS BY MOUTH 45 MINUTES PRIOR TO INTERCOURSE, Disp: 25 tablet, Rfl: 2   Objective:     BP 120/72 (BP Location:  Right Arm, Patient Position: Sitting, Cuff Size: Large)   Pulse (!) 57   Temp 98 F (36.7 C) (Temporal)   Ht 6\' 2"  (1.88 m)   Wt 194 lb 9.6 oz (88.3 kg)   SpO2 98%   BMI 24.99 kg/m    Physical Exam Constitutional:      General: He is not in acute distress.    Appearance: Normal appearance. He is not ill-appearing, toxic-appearing or diaphoretic.  HENT:     Head: Normocephalic and atraumatic.     Right Ear: Tympanic membrane, ear canal and external ear normal.     Left Ear: Tympanic membrane, ear canal and external ear normal.     Mouth/Throat:     Mouth: Mucous membranes are moist.     Pharynx: Oropharynx is clear. No oropharyngeal exudate or posterior oropharyngeal erythema.  Eyes:     General: No scleral icterus.       Right eye: No discharge.        Left eye: No discharge.     Extraocular Movements: Extraocular movements intact.     Conjunctiva/sclera: Conjunctivae normal.     Pupils: Pupils are equal, round, and reactive to light.  Cardiovascular:     Rate and Rhythm: Normal rate and regular rhythm.  Pulmonary:     Effort: Pulmonary effort is normal. No respiratory distress.     Breath sounds: Normal breath sounds.  Abdominal:  General: Bowel sounds are normal.     Tenderness: There is no abdominal tenderness. There is no guarding or rebound.  Musculoskeletal:     Cervical back: No rigidity or tenderness.  Skin:    General: Skin is warm and dry.  Neurological:     Mental Status: He is alert and oriented to person, place, and time.  Psychiatric:        Mood and Affect: Mood normal.        Behavior: Behavior normal.      No results found for any visits on 11/05/22.    The 10-year ASCVD risk score (Arnett DK, et al., 2019) is: 10.1%    Assessment & Plan:   Healthcare maintenance -     Comprehensive metabolic panel -     Lipid panel -     PSA -     Urinalysis, Routine w reflex microscopic  Heavy metal exposure -     Chromium level  Leukopenia,  unspecified type -     CBC with Differential/Platelet    Return in about 1 year (around 11/05/2023), or if symptoms worsen or fail to improve.  Will continue healthy active lifestyle.  Discussed intermediate 10-year vascular disease risk being mostly due to age.  He has a favorable lipid profile.  Chromium levels today.   Mliss Sax, MD

## 2022-11-06 LAB — PSA: PSA: 2.09 ng/mL (ref 0.10–4.00)

## 2022-11-07 LAB — CHROMIUM LEVEL: Chromium: 0.6 mcg/L (ref <=1.2)

## 2022-11-11 ENCOUNTER — Encounter: Payer: Self-pay | Admitting: Family Medicine

## 2022-11-17 IMAGING — US US THYROID
1 series · 14 of 25 positions shown · non-contrast
Comparison: None Available.

CLINICAL DATA: Thyroid nodule

EXAM:
THYROID ULTRASOUND
TECHNIQUE: Ultrasound examination of the thyroid gland and adjacent soft
tissues was performed.

[Series 1: us thyroid · 0.08mm/px · 14 of 28 slices shown]
[im 1/28]
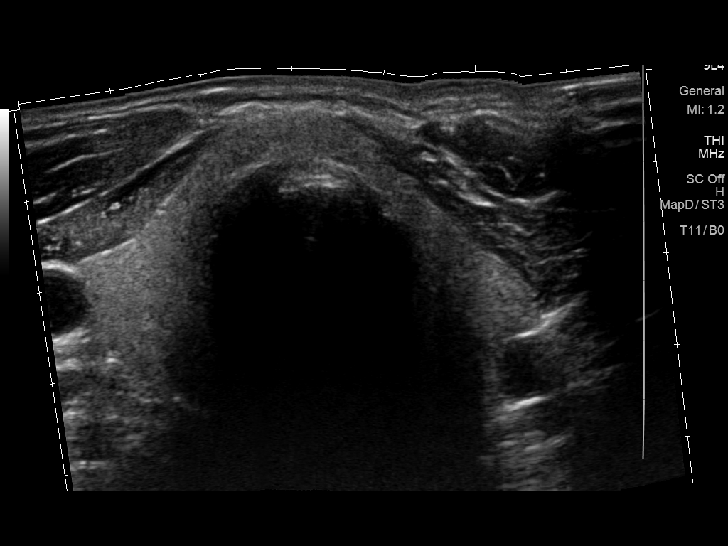
[im 3/28]
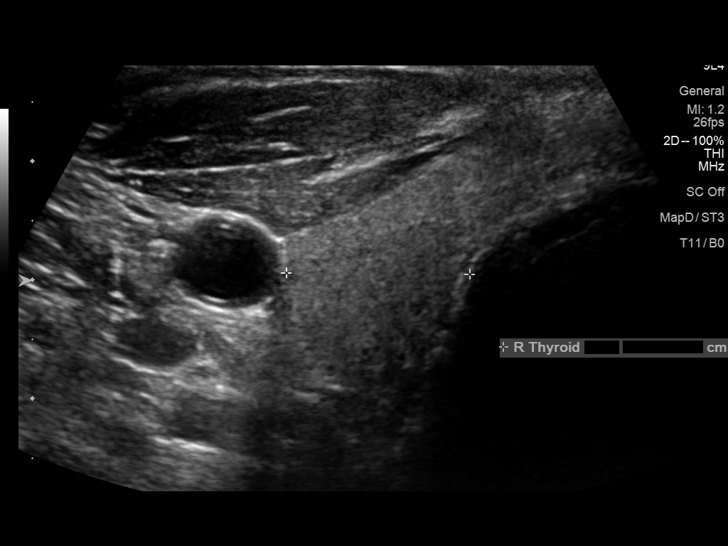
[im 5/28]
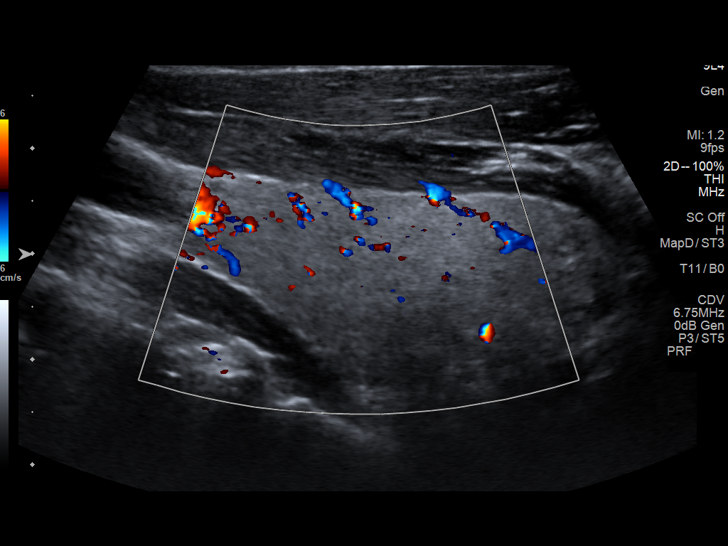
[im 7/28]
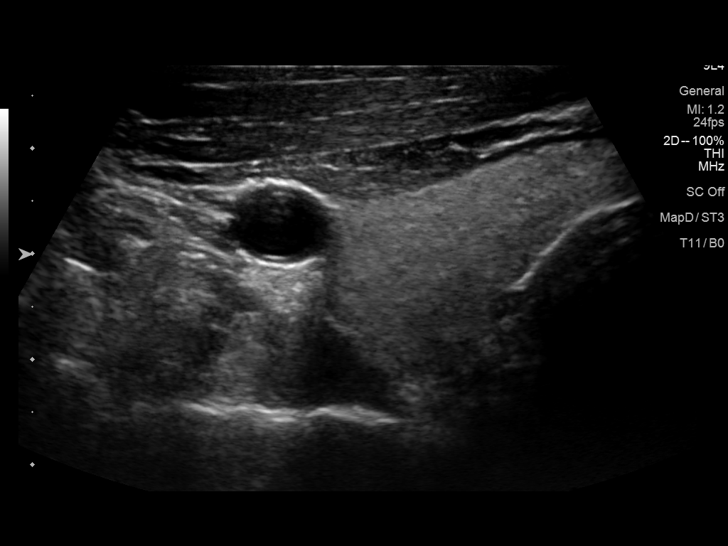
[im 10/28]
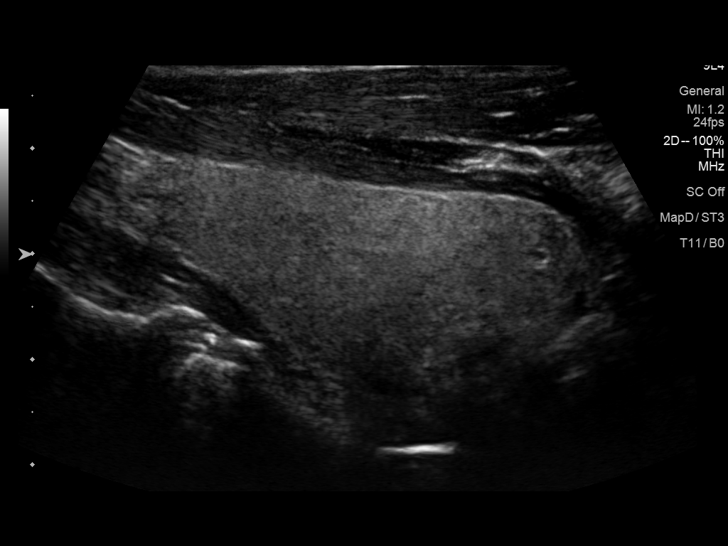
[im 11/28]
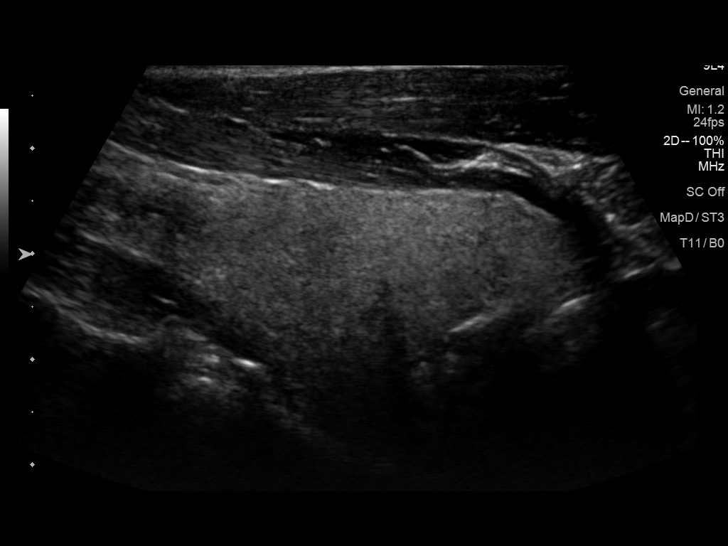
[im 13/28]
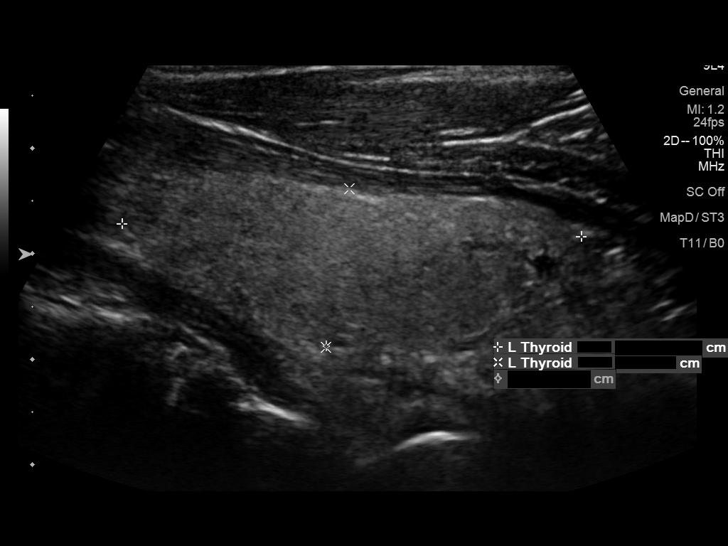
[im 15/28]
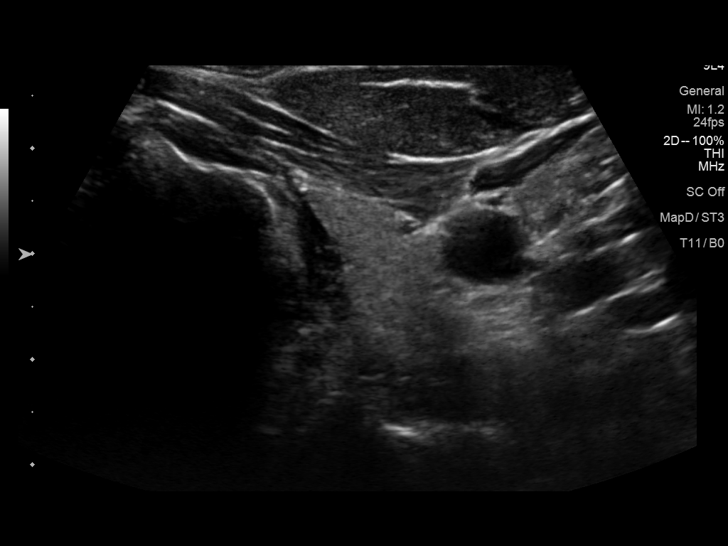
[im 17/28]
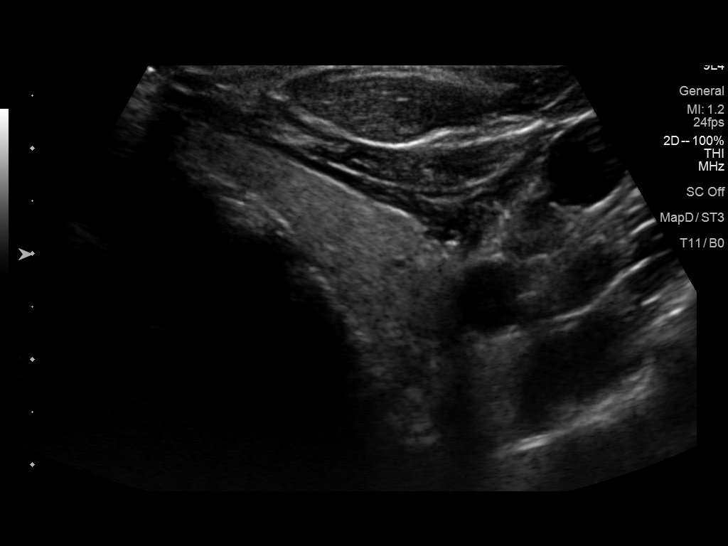
[im 19/28]
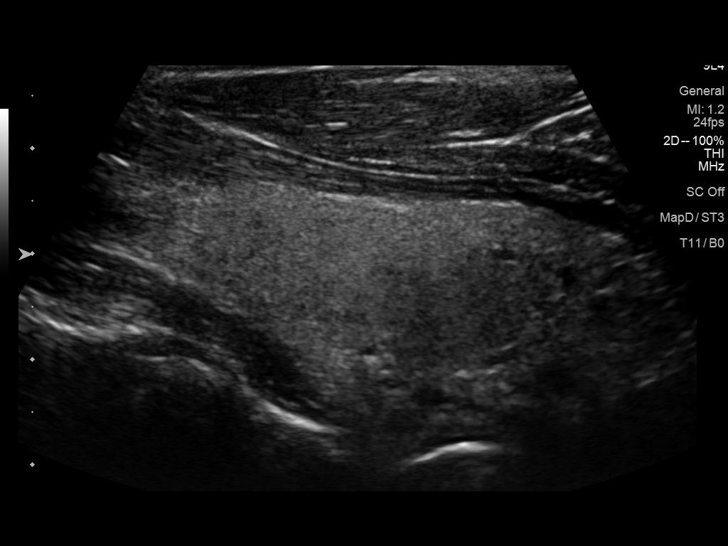
[im 21/28]
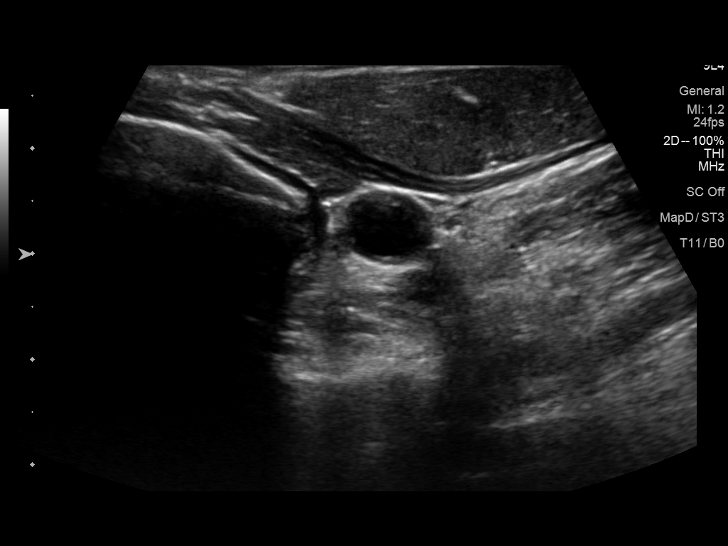
[im 23/28]
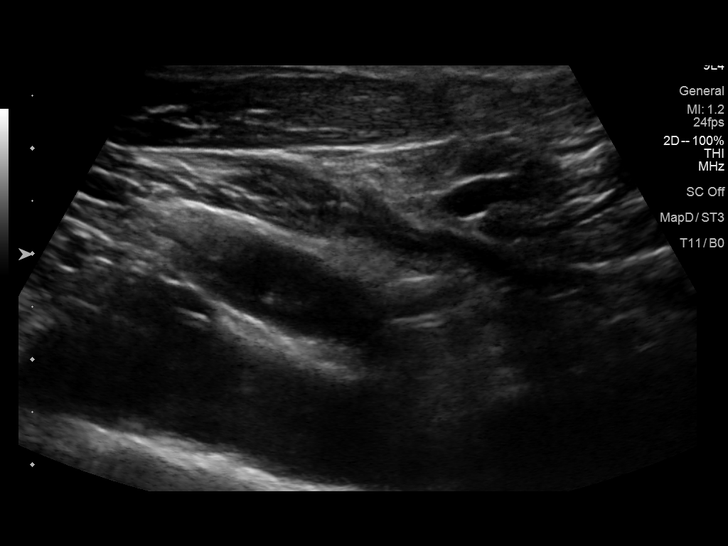
[im 25/28]
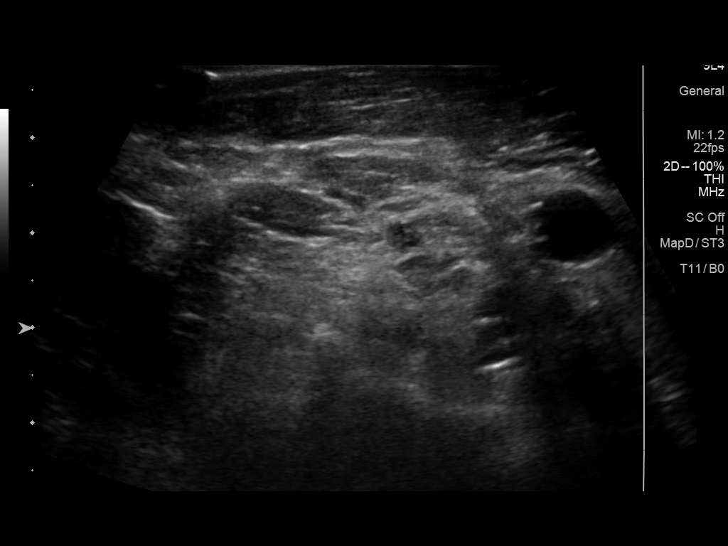
[im 28/28]
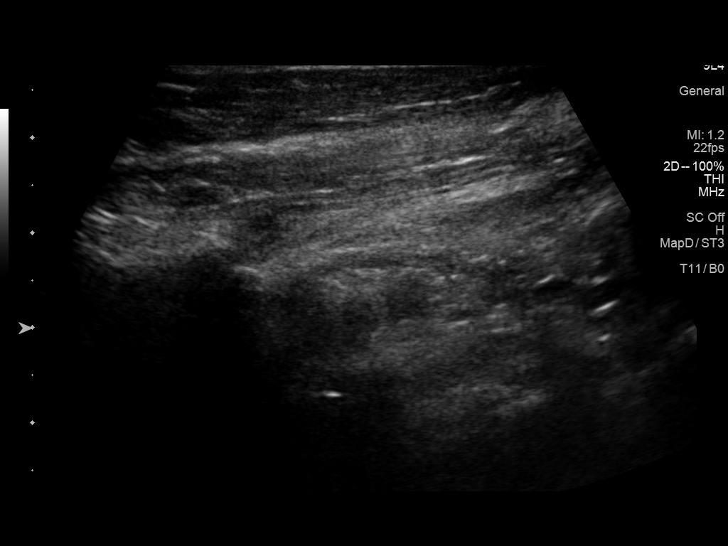

[14 of 25 positions shown; findings below may reference images not displayed]

FINDINGS: Parenchymal Echotexture: Normal

Isthmus: 0.3 cm

Right lobe: 4.7 x 2.0 x 1.5 cm

Left lobe: 4.4 x 1.5 x 1.6 cm

_________________________________________________________

Estimated total number of nodules >/= 1 cm: 0

Number of spongiform nodules >/=  2 cm not described below (TR1): 0

Number of mixed cystic and solid nodules >/= 1.5 cm not described
below (TR2): 0

_________________________________________________________

No discrete nodules are seen within the thyroid gland.
IMPRESSION: Normal size and appearance of the thyroid gland. No discrete thyroid
nodule.

## 2023-04-27 ENCOUNTER — Encounter: Payer: Self-pay | Admitting: Family Medicine

## 2023-04-27 DIAGNOSIS — N5201 Erectile dysfunction due to arterial insufficiency: Secondary | ICD-10-CM

## 2023-04-29 NOTE — Telephone Encounter (Signed)
Refill request for Sildenafil 20 mg  LR 12/11/21, #25, 2 rf LOV  11/05/22 FOV  06/10/23  Please review and advise.  Thanks. Dm/cma

## 2023-04-30 MED ORDER — SILDENAFIL CITRATE 20 MG PO TABS: ORAL_TABLET | ORAL | 2 refills | Status: DC

## 2023-06-24 ENCOUNTER — Ambulatory Visit (INDEPENDENT_AMBULATORY_CARE_PROVIDER_SITE_OTHER): Payer: Medicare Other

## 2023-06-24 VITALS — BP 120/70 | HR 72 | Temp 97.8°F | Ht 75.0 in | Wt 196.6 lb

## 2023-06-24 DIAGNOSIS — Z0000 Encounter for general adult medical examination without abnormal findings: Secondary | ICD-10-CM | POA: Diagnosis not present

## 2023-06-24 NOTE — Patient Instructions (Signed)
 Joseph Jennings , Thank you for taking time to come for your Medicare Wellness Visit. I appreciate your ongoing commitment to your health goals. Please review the following plan we discussed and let me know if I can assist you in the future.   Referrals/Orders/Follow-Ups/Clinician Recommendations: none  This is a list of the screening recommended for you and due dates:  Health Maintenance  Topic Date Due   Flu Shot  01/17/2023   Zoster (Shingles) Vaccine (1 of 2) 10/22/2023*   Colon Cancer Screening  04/16/2024   Medicare Annual Wellness Visit  06/23/2024   DTaP/Tdap/Td vaccine (3 - Td or Tdap) 07/03/2028   Pneumonia Vaccine  Completed   Hepatitis C Screening  Completed   HPV Vaccine  Aged Out   COVID-19 Vaccine  Discontinued  *Topic was postponed. The date shown is not the original due date.    Advanced directives: (Copy Requested) Please bring a copy of your health care power of attorney and living will to the office to be added to your chart at your convenience.  Next Medicare Annual Wellness Visit scheduled for next year: Yes  insert Preventive Care attachment Insert FALL PREVENTION attachment if needed

## 2023-06-24 NOTE — Progress Notes (Signed)
 Subjective:   Joseph Jennings is a 68 y.o. male who presents for Medicare Annual/Subsequent preventive examination.  Visit Complete: In person  Patient Medicare AWV questionnaire was completed by the patient on 06/23/2023; I have confirmed that all information answered by patient is correct and no changes since this date.  Cardiac Risk Factors include: advanced age (>75men, >21 women)     Objective:    Today's Vitals   06/24/23 1112  BP: 120/70  Pulse: 72  Temp: 97.8 F (36.6 C)  TempSrc: Oral  SpO2: 95%  Weight: 196 lb 9.6 oz (89.2 kg)  Height: 6' 3 (1.905 m)   Body mass index is 24.57 kg/m.     06/24/2023   11:16 AM 06/04/2022   11:13 AM 05/31/2021    9:53 AM  Advanced Directives  Does Patient Have a Medical Advance Directive? Yes Yes Yes  Type of Estate Agent of [place];Living will Healthcare Power of [place];Living will Healthcare Power of [place];Living will  Copy of Healthcare Power of Attorney in Chart? No - copy requested No - copy requested No - copy requested    Current Medications (verified) Outpatient Encounter Medications as of 06/24/2023  Medication Sig   albuterol  (VENTOLIN  HFA) 108 (90 Base) MCG/ACT inhaler Inhale 1-2 puffs into the lungs every 6 (six) hours as needed for wheezing or shortness of breath.   fluticasone -salmeterol (ADVAIR DISKUS) 250-50 MCG/ACT AEPB Inhale 1 puff into the lungs in the morning and at bedtime.   Quercetin 50 MG TABS 1 tablet   sildenafil  (REVATIO ) 20 MG tablet TAKE 3 TO 5 TABLETS BY MOUTH 45 MINUTES PRIOR TO INTERCOURSE   No facility-administered encounter medications on file as of 06/24/2023.    Allergies (verified) Cetirizine, Diclofenac, and Other   History: Past Medical History:  Diagnosis Date   Asthma    Past Surgical History:  Procedure Laterality Date   HIP SURGERY     Family History  Problem Relation Age of Onset   Hypertension Mother    Social History   Socioeconomic History    Marital status: Married    Spouse name: Not on file   Number of children: Not on file   Years of education: Not on file   Highest education level: Master's degree (e.g., MA, MS, MEng, MEd, MSW, MBA)  Occupational History   Not on file  Tobacco Use   Smoking status: Never   Smokeless tobacco: Never  Vaping Use   Vaping status: Never Used  Substance and Sexual Activity   Alcohol use: Yes    Alcohol/week: 2.0 standard drinks of alcohol    Types: 1 Glasses of wine, 1 Cans of beer per week    Comment: social   Drug use: Never   Sexual activity: Yes  Other Topics Concern   Not on file  Social History Narrative   Not on file   Social Drivers of Health   Financial Resource Strain: Low Risk  (06/24/2023)   Overall Financial Resource Strain (CARDIA)    Difficulty of Paying Living Expenses: Not hard at all  Food Insecurity: No Food Insecurity (06/24/2023)   Hunger Vital Sign    Worried About Running Out of Food in the Last Year: Never true    Ran Out of Food in the Last Year: Never true  Transportation Needs: No Transportation Needs (06/24/2023)   PRAPARE - Administrator, Civil Service (Medical): No    Lack of Transportation (Non-Medical): No  Physical Activity: Sufficiently Active (  06/24/2023)   Exercise Vital Sign    Days of Exercise per Week: 3 days    Minutes of Exercise per Session: 60 min  Stress: No Stress Concern Present (06/24/2023)   [employer] of Occupational Health - Occupational Stress Questionnaire    Feeling of Stress : Not at all  Social Connections: Socially Integrated (06/24/2023)   Social Connection and Isolation Panel [NHANES]    Frequency of Communication with Friends and Family: More than three times a week    Frequency of Social Gatherings with Friends and Family: Twice a week    Attends Religious Services: More than 4 times per year    Active Member of [employer] or Organizations: Yes    Attends Engineer, Structural: More than 4 times per  year    Marital Status: Married    Tobacco Counseling Counseling given: Not Answered   Clinical Intake:  Pre-visit preparation completed: Yes  Pain : No/denies pain     Nutritional Status: BMI of 19-24  Normal Nutritional Risks: None Diabetes: No  How often do you need to have someone help you when you read instructions, pamphlets, or other written materials from your doctor or pharmacy?: 1 - Never  Interpreter Needed?: No  Information entered by :: NAllen LPN   Activities of Daily Living    06/23/2023    2:08 PM  In your present state of health, do you have any difficulty performing the following activities:  Hearing? 0  Vision? 0  Difficulty concentrating or making decisions? 0  Walking or climbing stairs? 0  Dressing or bathing? 0  Doing errands, shopping? 0  Preparing Food and eating ? N  Using the Toilet? N  In the past six months, have you accidently leaked urine? N  Do you have problems with loss of bowel control? N  Managing your Medications? N  Managing your Finances? N  Housekeeping or managing your Housekeeping? N    Patient Care Team: Berneta Elsie Sayre, MD as PCP - General (Family Medicine)  Indicate any recent Medical Services you may have received from other than [facility] providers in the past year (date may be approximate).     Assessment:   This is a routine wellness examination for Joseph Jennings.  Hearing/Vision screen Hearing Screening - Comments:: Denies hearing issues Vision Screening - Comments:: Regular eye exams, Dr. Charmayne   Goals Addressed             This Visit's Progress    Patient Stated       06/24/2023, wants to retire and exercise more       Depression Screen    06/24/2023   11:17 AM 11/05/2022    9:12 AM 06/04/2022   11:15 AM 12/29/2021   10:49 AM 11/01/2021    9:02 AM 05/31/2021   10:01 AM 05/31/2021    9:53 AM  PHQ 2/9 Scores  PHQ - 2 Score 0 0 0 0 0 0 0  PHQ- 9 Score 0          Fall Risk    06/23/2023    2:08  PM 11/05/2022    9:12 AM 08/24/2022    2:54 PM 06/04/2022   11:15 AM 12/29/2021   10:49 AM  Fall Risk   Falls in the past year? 0 0 0 0 0  Number falls in past yr: 0 0 0 0 0  Injury with Fall? 0 0 0 0   Risk for fall due to : Medication side effect No  Fall Risks  Medication side effect   Follow up Falls prevention discussed;Falls evaluation completed Falls evaluation completed  Falls prevention discussed;Education provided;Falls evaluation completed     MEDICARE RISK AT HOME: Medicare Risk at Home Any stairs in or around the home?: (Patient-Rptd) Yes If so, are there any without handrails?: (Patient-Rptd) No Home free of loose throw rugs in walkways, pet beds, electrical cords, etc?: (Patient-Rptd) Yes Adequate lighting in your home to reduce risk of falls?: (Patient-Rptd) Yes Life alert?: (Patient-Rptd) No Use of a cane, walker or w/c?: (Patient-Rptd) No Grab bars in the bathroom?: (Patient-Rptd) No Shower chair or bench in shower?: No Elevated toilet seat or a handicapped toilet?: (Patient-Rptd) No  TIMED UP AND GO:  Was the test performed?  Yes  Length of time to ambulate 10 feet: 5 sec Gait steady and fast without use of assistive device    Cognitive Function:        06/24/2023   11:18 AM 06/04/2022   11:16 AM  6CIT Screen  What Year? 0 points 0 points  What month? 0 points 0 points  What time? 0 points 0 points  Count back from 20 0 points 0 points  Months in reverse 0 points 0 points  Repeat phrase 2 points 0 points  Total Score 2 points 0 points    Immunizations Immunization History  Administered Date(s) Administered   Fluad Quad(high Dose 65+) 03/15/2022   Hepatitis A, Adult 07/03/2018, 10/02/2019   Influenza Split 03/27/2012, 02/18/2014, 03/28/2016, 04/20/2017, 03/29/2018, 04/04/2019   Influenza,inj,Quad PF,6+ Mos 04/18/2018   Influenza,inj,quad, With Preservative 03/17/2015   PFIZER(Purple Top)SARS-COV-2 Vaccination 08/28/2019, 09/18/2019, 05/29/2020    PNEUMOCOCCAL CONJUGATE-20 11/01/2021   Tdap 02/12/2012, 07/03/2018   Unspecified SARS-COV-2 Vaccination 05/28/2022    TDAP status: Up to date  Flu Vaccine status: Up to date  Pneumococcal vaccine status: Up to date  Covid-19 vaccine status: Completed vaccines  Qualifies for Shingles Vaccine? Yes   Zostavax completed No   Shingrix Completed?: No.    Education has been provided regarding the importance of this vaccine. Patient has been advised to call insurance company to determine out of pocket expense if they have not yet received this vaccine. Advised may also receive vaccine at local pharmacy or Health Dept. Verbalized acceptance and understanding.  Screening Tests Health Maintenance  Topic Date Due   INFLUENZA VACCINE  01/17/2023   Zoster Vaccines- Shingrix (1 of 2) 10/22/2023 (Originally 05/17/1975)   Colonoscopy  04/16/2024   Medicare Annual Wellness (AWV)  06/23/2024   DTaP/Tdap/Td (3 - Td or Tdap) 07/03/2028   Pneumonia Vaccine 62+ Years old  Completed   Hepatitis C Screening  Completed   HPV VACCINES  Aged Out   COVID-19 Vaccine  Discontinued    Health Maintenance  Health Maintenance Due  Topic Date Due   INFLUENZA VACCINE  01/17/2023    Colorectal cancer screening: Type of screening: Colonoscopy. Completed 04/16/2014. Repeat every 10 years  Lung Cancer Screening: (Low Dose CT Chest recommended if Age 19-80 years, 20 pack-year currently smoking OR have quit w/in 15years.) does not qualify.   Lung Cancer Screening Referral: no  Additional Screening:  Hepatitis C Screening: does qualify; Completed 11/01/2021  Vision Screening: Recommended annual ophthalmology exams for early detection of glaucoma and other disorders of the eye. Is the patient up to date with their annual eye exam?  Yes  Who is the provider or what is the name of the office in which the patient attends annual eye exams? Dr. Charmayne  If pt is not established with a provider, would they like to be  referred to a provider to establish care? No .   Dental Screening: Recommended annual dental exams for proper oral hygiene  Diabetic Foot Exam: n/a  Community Resource Referral / Chronic Care Management: CRR required this visit?  No   CCM required this visit?  No     Plan:     I have personally reviewed and noted the following in the patient's chart:   Medical and social history Use of alcohol, tobacco or illicit drugs  Current medications and supplements including opioid prescriptions. Patient is not currently taking opioid prescriptions. Functional ability and status Nutritional status Physical activity Advanced directives List of other physicians Hospitalizations, surgeries, and ER visits in previous 12 months Vitals Screenings to include cognitive, depression, and falls Referrals and appointments  In addition, I have reviewed and discussed with patient certain preventive protocols, quality metrics, and best practice recommendations. A written personalized care plan for preventive services as well as general preventive health recommendations were provided to patient.     Ardella FORBES Dawn, LPN   01/18/7973   After Visit Summary: (In Person-Printed) AVS printed and given to the patient  Nurse Notes: none

## 2023-07-15 ENCOUNTER — Encounter: Payer: Self-pay | Admitting: Family Medicine

## 2023-09-11 ENCOUNTER — Encounter: Payer: Self-pay | Admitting: Family Medicine

## 2023-11-08 ENCOUNTER — Ambulatory Visit (INDEPENDENT_AMBULATORY_CARE_PROVIDER_SITE_OTHER): Admitting: Family Medicine

## 2023-11-08 ENCOUNTER — Encounter: Payer: Self-pay | Admitting: Family Medicine

## 2023-11-08 VITALS — BP 114/70 | HR 67 | Temp 97.2°F | Ht 75.0 in | Wt 192.4 lb

## 2023-11-08 DIAGNOSIS — Z1211 Encounter for screening for malignant neoplasm of colon: Secondary | ICD-10-CM

## 2023-11-08 DIAGNOSIS — D709 Neutropenia, unspecified: Secondary | ICD-10-CM

## 2023-11-08 DIAGNOSIS — N401 Enlarged prostate with lower urinary tract symptoms: Secondary | ICD-10-CM

## 2023-11-08 DIAGNOSIS — Z13220 Encounter for screening for lipoid disorders: Secondary | ICD-10-CM

## 2023-11-08 DIAGNOSIS — Z0000 Encounter for general adult medical examination without abnormal findings: Secondary | ICD-10-CM | POA: Diagnosis not present

## 2023-11-08 DIAGNOSIS — Z131 Encounter for screening for diabetes mellitus: Secondary | ICD-10-CM

## 2023-11-08 DIAGNOSIS — Z9189 Other specified personal risk factors, not elsewhere classified: Secondary | ICD-10-CM

## 2023-11-08 LAB — LIPID PANEL
Cholesterol: 188 mg/dL (ref 0–200)
HDL: 78.5 mg/dL (ref >39.00)
LDL Cholesterol: 95 mg/dL (ref 0–99)
NonHDL: 109.35
Total CHOL/HDL Ratio: 2
Triglycerides: 72 mg/dL (ref 0.0–149.0)
VLDL: 14.4 mg/dL (ref 0.0–40.0)

## 2023-11-08 LAB — CBC WITH DIFFERENTIAL/PLATELET
Basophils Absolute: 0 10*3/uL (ref 0.0–0.1)
Basophils Relative: 0.7 % (ref 0.0–3.0)
Eosinophils Absolute: 0.1 10*3/uL (ref 0.0–0.7)
Eosinophils Relative: 3.3 % (ref 0.0–5.0)
HCT: 43.9 % (ref 39.0–52.0)
Hemoglobin: 14.6 g/dL (ref 13.0–17.0)
Lymphocytes Relative: 39.4 % (ref 12.0–46.0)
Lymphs Abs: 1.2 10*3/uL (ref 0.7–4.0)
MCHC: 33.1 g/dL (ref 30.0–36.0)
MCV: 94.9 fl (ref 78.0–100.0)
Monocytes Absolute: 0.4 10*3/uL (ref 0.1–1.0)
Monocytes Relative: 12 % (ref 3.0–12.0)
Neutro Abs: 1.4 10*3/uL (ref 1.4–7.7)
Neutrophils Relative %: 44.6 % (ref 43.0–77.0)
Platelets: 208 10*3/uL (ref 150.0–400.0)
RBC: 4.63 Mil/uL (ref 4.22–5.81)
RDW: 13.9 % (ref 11.5–15.5)
WBC: 3.2 10*3/uL — ABNORMAL LOW (ref 4.0–10.5)

## 2023-11-08 LAB — URINALYSIS, ROUTINE W REFLEX MICROSCOPIC
Bilirubin Urine: NEGATIVE
Hgb urine dipstick: NEGATIVE
Ketones, ur: NEGATIVE
Leukocytes,Ua: NEGATIVE
Nitrite: NEGATIVE
Specific Gravity, Urine: <=1.005 — AB (ref 1.000–1.030)
Total Protein, Urine: NEGATIVE
Urine Glucose: NEGATIVE
Urobilinogen, UA: 0.2 (ref 0.0–1.0)
pH: 7 (ref 5.0–8.0)

## 2023-11-08 LAB — COMPREHENSIVE METABOLIC PANEL WITH GFR
ALT: 15 U/L (ref 0–53)
AST: 17 U/L (ref 0–37)
Albumin: 4.5 g/dL (ref 3.5–5.2)
Alkaline Phosphatase: 78 U/L (ref 39–117)
BUN: 14 mg/dL (ref 6–23)
CO2: 27 meq/L (ref 19–32)
Calcium: 9.2 mg/dL (ref 8.4–10.5)
Chloride: 104 meq/L (ref 96–112)
Creatinine, Ser: 1.06 mg/dL (ref 0.40–1.50)
GFR: 72.13 mL/min (ref >60.00)
Glucose, Bld: 82 mg/dL (ref 70–99)
Potassium: 4.3 meq/L (ref 3.5–5.1)
Sodium: 139 meq/L (ref 135–145)
Total Bilirubin: 0.6 mg/dL (ref 0.2–1.2)
Total Protein: 7.2 g/dL (ref 6.0–8.3)

## 2023-11-08 LAB — HEMOGLOBIN A1C: Hgb A1c MFr Bld: 5.9 % (ref 4.6–6.5)

## 2023-11-08 LAB — PSA: PSA: 0.96 ng/mL (ref 0.10–4.00)

## 2023-11-08 NOTE — Progress Notes (Signed)
 Established Patient Office Visit   Subjective:  Patient ID: Joseph Jennings, male    DOB: 1956-01-26  Age: 68 y.o. MRN: [id number]  Chief Complaint  Patient presents with   Medical Management of Chronic Issues    1 year follow up. Pt is fasting. No concerns. Will shingrix soon at his pharmacy.     HPI Encounter Diagnoses  Name Primary?   Healthcare maintenance Yes   Screening for cholesterol level    Screening for diabetes mellitus    At risk for coronary artery disease    Screening for colon cancer    Benign prostatic hyperplasia with lower urinary tract symptoms, symptom details unspecified    Neutropenia, unspecified type (HCC)    For physical exam today.  Doing well.  He is active physically at least 150 minutes weekly.  He does have regular dental care.  Recently retired but is staying busy and engaged.  Due for colonoscopy.  Urine flow is more or less been okay but he does experience urinary urgency at times.   Review of Systems  Constitutional: Negative.   HENT: Negative.    Eyes:  Negative for blurred vision, discharge and redness.  Respiratory: Negative.    Cardiovascular: Negative.   Gastrointestinal:  Negative for abdominal pain.  Genitourinary: Negative.   Musculoskeletal: Negative.  Negative for myalgias.  Skin:  Negative for rash.  Neurological:  Negative for tingling, loss of consciousness and weakness.  Endo/Heme/Allergies:  Negative for polydipsia.  Psychiatric/Behavioral:  Negative for depression. The patient is not nervous/anxious.      Current Outpatient Medications:    albuterol  (VENTOLIN  HFA) 108 (90 Base) MCG/ACT inhaler, Inhale 1-2 puffs into the lungs every 6 (six) hours as needed for wheezing or shortness of breath., Disp: 1 each, Rfl: 0   fluticasone -salmeterol (ADVAIR DISKUS) 250-50 MCG/ACT AEPB, Inhale 1 puff into the lungs in the morning and at bedtime., Disp: 3 each, Rfl: 1   Quercetin 50 MG TABS, 1 tablet, Disp: , Rfl:    sildenafil   (REVATIO ) 20 MG tablet, TAKE 3 TO 5 TABLETS BY MOUTH 45 MINUTES PRIOR TO INTERCOURSE, Disp: 25 tablet, Rfl: 2   Objective:     BP 114/70 (Cuff Size: Normal)   Pulse 67   Temp (!) 97.2 F (36.2 C) (Temporal)   Ht 6\' 3"  (1.905 m)   Wt 192 lb 6.4 oz (87.3 kg)   SpO2 97%   BMI 24.05 kg/m    Physical Exam Constitutional:      General: He is not in acute distress.    Appearance: Normal appearance. He is not ill-appearing, toxic-appearing or diaphoretic.  HENT:     Head: Normocephalic and atraumatic.     Right Ear: Tympanic membrane, ear canal and external ear normal.     Left Ear: Tympanic membrane, ear canal and external ear normal.     Mouth/Throat:     Mouth: Mucous membranes are moist.     Pharynx: Oropharynx is clear. No oropharyngeal exudate or posterior oropharyngeal erythema.  Eyes:     General: No scleral icterus.       Right eye: No discharge.        Left eye: No discharge.     Extraocular Movements: Extraocular movements intact.     Conjunctiva/sclera: Conjunctivae normal.     Pupils: Pupils are equal, round, and reactive to light.  Cardiovascular:     Rate and Rhythm: Normal rate and regular rhythm.  Pulmonary:     Effort: Pulmonary effort  is normal. No respiratory distress.     Breath sounds: Normal breath sounds.  Abdominal:     General: Bowel sounds are normal.     Tenderness: There is no abdominal tenderness. There is no guarding.     Hernia: There is no hernia in the left inguinal area or right inguinal area.  Genitourinary:    Penis: Circumcised. No hypospadias, erythema, tenderness, discharge, swelling or lesions.      Testes:        Right: Mass, tenderness or swelling not present. Right testis is descended.        Left: Mass, tenderness or swelling not present. Left testis is descended.     Epididymis:     Right: Not inflamed or enlarged. No mass.     Left: Not enlarged. No mass.     Prostate: Enlarged. Not tender and no nodules present.     Rectum:  Guaiac result negative. No mass, tenderness, anal fissure, external hemorrhoid or internal hemorrhoid. Normal anal tone.  Musculoskeletal:     Cervical back: No rigidity or tenderness.  Lymphadenopathy:     Lower Body: No right inguinal adenopathy. No left inguinal adenopathy.  Skin:    General: Skin is warm and dry.  Neurological:     Mental Status: He is alert and oriented to person, place, and time.  Psychiatric:        Mood and Affect: Mood normal.        Behavior: Behavior normal.      No results found for any visits on 11/08/23.    The 10-year ASCVD risk score (Arnett DK, et al., 2019) is: 11.2%    Assessment & Plan:   Healthcare maintenance -     Urinalysis, Routine w reflex microscopic  Screening for cholesterol level -     Comprehensive metabolic panel with GFR -     Lipid panel  Screening for diabetes mellitus -     Comprehensive metabolic panel with GFR -     Hemoglobin A1c  At risk for coronary artery disease -     CT CARDIAC SCORING (SELF PAY ONLY); Future  Screening for colon cancer -     Ambulatory referral to Gastroenterology  Benign prostatic hyperplasia with lower urinary tract symptoms, symptom details unspecified -     PSA  Neutropenia, unspecified type (HCC) -     CBC with Differential/Platelet    Return in about 1 year (around 11/07/2024).  Advised him to continue his healthy active lifestyle.  Information was given on health maintenance and disease prevention.  Agrees to go for calcium scoring for his coronary arteries with his intermediate risk of vascular disease.  Tonna Frederic, MD

## 2023-11-11 ENCOUNTER — Ambulatory Visit: Payer: Self-pay | Admitting: Family Medicine

## 2023-11-20 ENCOUNTER — Ambulatory Visit (HOSPITAL_BASED_OUTPATIENT_CLINIC_OR_DEPARTMENT_OTHER)
Admission: RE | Admit: 2023-11-20 | Discharge: 2023-11-20 | Disposition: A | Discharge status: Home / Self Care | Payer: Self-pay | Source: Ambulatory Visit | Attending: Family Medicine | Admitting: Family Medicine

## 2023-11-20 DIAGNOSIS — Z9189 Other specified personal risk factors, not elsewhere classified: Secondary | ICD-10-CM | POA: Insufficient documentation

## 2023-11-23 ENCOUNTER — Encounter: Payer: Self-pay | Admitting: Family Medicine

## 2023-12-02 ENCOUNTER — Encounter: Payer: Self-pay | Admitting: Pediatrics

## 2023-12-26 ENCOUNTER — Ambulatory Visit: Admitting: *Deleted

## 2023-12-26 VITALS — Ht 74.0 in | Wt 190.0 lb

## 2023-12-26 DIAGNOSIS — Z1211 Encounter for screening for malignant neoplasm of colon: Secondary | ICD-10-CM

## 2023-12-26 MED ORDER — NA SULFATE-K SULFATE-MG SULF 17.5-3.13-1.6 GM/177ML PO SOLN: 1.0000 | Freq: Once | ORAL | 0 refills | Status: AC

## 2023-12-26 NOTE — Progress Notes (Signed)
 Pt's name and DOB verified at the beginning of the pre-visit wit 2 identifiers   Pt denies any difficulty with ambulating,sitting, laying down or rolling side to side  Pt has no issues moving head neck or swallowing  No egg or soy allergy known to patient   No issues known to pt with past sedation with any surgeries or procedures  No FH of Malignant Hyperthermia  Pt is not on home 02   Pt is not on blood thinners   Pt denies issues with constipation   Pt is not on dialysis  Pt denise any abnormal heart rhythms   Pt denies any upcoming cardiac testing  Patient's chart reviewed by Rogena Class CNRA prior to pre-visit and patient appropriate for the [facility].  Pre-visit completed and red dot placed by patient's name on their procedure day (on provider's schedule).    Visit by phone  Pt states weight is 190 lb     IInstructions reviewed. Pt given  both [facility] main # and MD on call # prior to instructions.  Pt states understanding of instructions. Instructed pt to review instructions again prior to procedure and call main # given if has questions.. Pt states they will.   Instructed pt on where to find instructions on My Chart.

## 2024-01-14 NOTE — Progress Notes (Unsigned)
 [facility] History and Physical   Primary Care Physician:  Berneta Elsie Sayre, MD   Reason for Procedure:  Colorectal cancer screening  Plan:    Screening colonoscopy     HPI: Joseph Jennings is a 68 y.o. male undergoing screening colonoscopy for colorectal cancer screening.  Patient reports that last colonoscopy was performed 04/16/2014 and advised to have a 10-year interval.  No family history of colorectal cancer or polyps.  Patient denies current symptoms of change in bowel habits or rectal bleeding at the time of this exam.   Past Medical History:  Diagnosis Date   Asthma     Past Surgical History:  Procedure Laterality Date   HIP SURGERY      Prior to Admission medications   Medication Sig Start Date End Date Taking? Authorizing Provider  albuterol  (VENTOLIN  HFA) 108 (90 Base) MCG/ACT inhaler Inhale 1-2 puffs into the lungs every 6 (six) hours as needed for wheezing or shortness of breath. 08/07/21   Berneta Elsie Sayre, MD  fluticasone -salmeterol (ADVAIR DISKUS) 250-50 MCG/ACT AEPB Inhale 1 puff into the lungs in the morning and at bedtime. 11/05/22   Berneta Elsie Sayre, MD  Quercetin 50 MG TABS 1 tablet    [provider]  sildenafil  (REVATIO ) 20 MG tablet TAKE 3 TO 5 TABLETS BY MOUTH 45 MINUTES PRIOR TO INTERCOURSE Patient not taking: Reported on 12/26/2023 04/30/23   Berneta Elsie Sayre, MD    Current Outpatient Medications  Medication Sig Dispense Refill   fluticasone -salmeterol (ADVAIR DISKUS) 250-50 MCG/ACT AEPB Inhale 1 puff into the lungs in the morning and at bedtime. 3 each 1   Quercetin 50 MG TABS 1 tablet     albuterol  (VENTOLIN  HFA) 108 (90 Base) MCG/ACT inhaler Inhale 1-2 puffs into the lungs every 6 (six) hours as needed for wheezing or shortness of breath. 1 each 0   sildenafil  (REVATIO ) 20 MG tablet TAKE 3 TO 5 TABLETS BY MOUTH 45 MINUTES PRIOR TO INTERCOURSE (Patient not taking: Reported on 12/26/2023) 25 tablet 2    Current Facility-Administered Medications  Medication Dose Route Frequency Provider Last Rate Last Admin   0.9 %  sodium chloride  infusion  500 mL Intravenous Once Suzann Inocente HERO, MD        Allergies as of 01/15/2024 - Review Complete 01/15/2024  Allergen Reaction Noted   Cetirizine Hives and Other (See Comments) 08/02/2017   Diclofenac Hives and Rash 05/29/2017   Other Hives 10/02/2019    Family History  Problem Relation Age of Onset   Hypertension Mother    Colon polyps Neg Hx    Colon cancer Neg Hx    Esophageal cancer Neg Hx    Rectal cancer Neg Hx    Stomach cancer Neg Hx     Social History   Socioeconomic History   Marital status: Married    Spouse name: Not on file   Number of children: Not on file   Years of education: Not on file   Highest education level: Master's degree (e.g., MA, MS, MEng, MEd, MSW, MBA)  Occupational History   Not on file  Tobacco Use   Smoking status: Never   Smokeless tobacco: Never  Vaping Use   Vaping status: Never Used  Substance and Sexual Activity   Alcohol use: Yes    Alcohol/week: 2.0 standard drinks of alcohol    Types: 1 Glasses of wine, 1 Cans of beer per week    Comment: social   Drug use: Never   Sexual  activity: Yes  Other Topics Concern   Not on file  Social History Narrative   Not on file   Social Drivers of Health   Financial Resource Strain: Low Risk  (11/04/2023)   Overall Financial Resource Strain (CARDIA)    Difficulty of Paying Living Expenses: Not hard at all  Food Insecurity: No Food Insecurity (11/04/2023)   Hunger Vital Sign    Worried About Running Out of Food in the Last Year: Never true    Ran Out of Food in the Last Year: Never true  Transportation Needs: No Transportation Needs (11/04/2023)   PRAPARE - Administrator, Civil Service (Medical): No    Lack of Transportation (Non-Medical): No  Physical Activity: Sufficiently Active (11/04/2023)   Exercise Vital Sign    Days of  Exercise per Week: 5 days    Minutes of Exercise per Session: 60 min  Stress: No Stress Concern Present (11/04/2023)   [employer] of Occupational Health - Occupational Stress Questionnaire    Feeling of Stress : Not at all  Social Connections: Socially Integrated (11/04/2023)   Social Connection and Isolation Panel    Frequency of Communication with Friends and Family: More than three times a week    Frequency of Social Gatherings with Friends and Family: Three times a week    Attends Religious Services: More than 4 times per year    Active Member of Clubs or Organizations: Yes    Attends Banker Meetings: More than 4 times per year    Marital Status: Married  Catering manager Violence: Not At Risk (06/24/2023)   Humiliation, Afraid, Rape, and Kick questionnaire    Fear of Current or Ex-Partner: No    Emotionally Abused: No    Physically Abused: No    Sexually Abused: No    Review of Systems:  All other review of systems negative except as mentioned in the HPI.  Physical Exam: Vital signs BP 138/88   Pulse 75   Temp 98 F (36.7 C)   Resp 18   Ht 6' 2 (1.88 m)   Wt 192 lb (87.1 kg)   SpO2 99%   BMI 24.65 kg/m   General:   Alert,  Well-developed, well-nourished, pleasant and cooperative in NAD Airway:  Mallampati 2 Lungs:  Clear throughout to auscultation.   Heart:  Regular rate and rhythm; no murmurs, clicks, rubs,  or gallops. Abdomen:  Soft, nontender and nondistended. Normal bowel sounds.   Neuro/Psych:  Normal mood and affect. A and O x 3  [name], MD [facility]

## 2024-01-15 ENCOUNTER — Ambulatory Visit: Admitting: Pediatrics

## 2024-01-15 ENCOUNTER — Encounter: Payer: Self-pay | Admitting: Pediatrics

## 2024-01-15 VITALS — BP 136/79 | HR 68 | Temp 98.0°F | Resp 16 | Ht 74.0 in | Wt 192.0 lb

## 2024-01-15 DIAGNOSIS — K562 Volvulus: Secondary | ICD-10-CM

## 2024-01-15 DIAGNOSIS — K635 Polyp of colon: Secondary | ICD-10-CM

## 2024-01-15 DIAGNOSIS — K648 Other hemorrhoids: Secondary | ICD-10-CM | POA: Diagnosis not present

## 2024-01-15 DIAGNOSIS — D124 Benign neoplasm of descending colon: Secondary | ICD-10-CM

## 2024-01-15 DIAGNOSIS — Z1211 Encounter for screening for malignant neoplasm of colon: Secondary | ICD-10-CM

## 2024-01-15 DIAGNOSIS — D125 Benign neoplasm of sigmoid colon: Secondary | ICD-10-CM

## 2024-01-15 DIAGNOSIS — D123 Benign neoplasm of transverse colon: Secondary | ICD-10-CM | POA: Diagnosis not present

## 2024-01-15 MED ORDER — SODIUM CHLORIDE 0.9 % IV SOLN: 500.0000 mL | Freq: Once | INTRAVENOUS | Status: DC

## 2024-01-15 NOTE — Progress Notes (Signed)
 Pt's states no medical or surgical changes since previsit or office visit.

## 2024-01-15 NOTE — Op Note (Signed)
 [facility] Patient Name: Joseph Jennings Procedure Date: 01/15/2024 9:42 AM MRN: [id number] Endoscopist: [name] , MD, [id number] Age: 68 Referring MD:  Date of Birth: [birth year] Gender: Male Account #: [id number] Procedure:                Colonoscopy Indications:              Screening for colorectal malignant neoplasm, Last                            colonoscopy: 2015 Medicines:                Monitored Anesthesia Care Procedure:                Pre-Anesthesia Assessment:                           - Prior to the procedure, a History and Physical                            was performed, and patient medications and                            allergies were reviewed. The patient's tolerance of                            previous anesthesia was also reviewed. The risks                            and benefits of the procedure and the sedation                            options and risks were discussed with the patient.                            All questions were answered, and informed consent                            was obtained. Prior Anticoagulants: The patient has                            taken no anticoagulant or antiplatelet agents. ASA                            Grade Assessment: II - A patient with mild systemic                            disease. After reviewing the risks and benefits,                            the patient was deemed in satisfactory condition to                            undergo the procedure.  After obtaining informed consent, the colonoscope                            was passed under direct vision. Throughout the                            procedure, the patient's blood pressure, pulse, and                            oxygen saturations were monitored continuously. The                            Olympus Scope SN: [id number] was introduced through                            the anus and advanced to the cecum,  identified by                            appendiceal orifice and ileocecal valve. The                            colonoscopy was technically difficult and complex                            due to significant looping and a tortuous colon.                            Successful completion of the procedure was aided by                            changing the patient to a supine position, using                            manual pressure and straightening and shortening                            the scope to obtain bowel loop reduction. The                            patient tolerated the procedure well. The quality                            of the bowel preparation was good. The ileocecal                            valve, appendiceal orifice, and rectum were                            photographed. Scope In: 9:50:08 AM Scope Out: 10:25:55 AM Scope Withdrawal Time: 0 hours 13 minutes 34 seconds  Total Procedure Duration: 0 hours 35 minutes 47 seconds  Findings:                 The perianal and digital rectal examinations were  normal. Pertinent negatives include normal                            sphincter tone and no palpable rectal lesions.                           The colon (entire examined portion) was moderately                            tortuous.                           Two sessile polyps were found in the sigmoid colon                            and transverse colon. The polyps were 3 to 4 mm in                            size. These polyps were removed with a cold biopsy                            forceps. Resection and retrieval were complete.                           A 6 mm polyp was found in the transverse colon. The                            polyp was sessile. Polypectomy was attempted,                            initially using a cold snare. Polyp resection was                            incomplete with this device. This intervention then                             required a different device and polypectomy                            technique. The polyp was removed with a cold biopsy                            forceps. Resection and retrieval were complete.                           Internal hemorrhoids were found during                            retroflexion. Scattered diminutive hyperplastic                            appearing polyps were seen in the rectum and not  removed due to benign appearance. Complications:            No immediate complications. Estimated blood loss:                            Minimal. Estimated Blood Loss:     Estimated blood loss was minimal. Impression:               - Tortuous colon.                           - Two 3 to 4 mm polyps in the sigmoid colon and in                            the transverse colon, removed with a cold biopsy                            forceps. Resected and retrieved.                           - One 6 mm polyp in the transverse colon, removed                            with a cold biopsy forceps. Resected and retrieved.                           - Internal hemorrhoids. Recommendation:           - Discharge patient to home (ambulatory).                           - Await pathology results.                           - Repeat colonoscopy for surveillance based on                            pathology results. Suggest use of an abdominal                            binder at the time of next colonoscopy given                            tortuous colon and significant looping.                           - The findings and recommendations were discussed                            with the patient's family.                           - Return to referring physician.                           - Patient has a contact number available for  emergencies. The signs and symptoms of potential                            delayed complications  were discussed with the                            patient. Return to normal activities tomorrow.                            Written discharge instructions were provided to the                            patient. [name], MD 01/15/2024 10:33:01 AM This report has been signed electronically.

## 2024-01-15 NOTE — Progress Notes (Signed)
 Called to room to assist during endoscopic procedure.  Patient ID and intended procedure confirmed with present staff. Received instructions for my participation in the procedure from the performing physician.

## 2024-01-15 NOTE — Progress Notes (Signed)
 Report given to PACU, vss

## 2024-01-15 NOTE — Patient Instructions (Signed)
 Handout on polyps and hemorrhoids.  Await pathology results  Resume previous diet and continue present medications  Repeat colonoscopy for surveillance will be determined based off of pathology results - suggest use of an abdominal binder at the time of next colonoscopy given tortuous colon and significant looping    YOU HAD AN ENDOSCOPIC PROCEDURE TODAY AT THE [facility]:   Refer to the procedure report that was given to you for any specific questions about what was found during the examination.  If the procedure report does not answer your questions, please call your gastroenterologist to clarify.  If you requested that your care partner not be given the details of your procedure findings, then the procedure report has been included in a sealed envelope for you to review at your convenience later.  YOU SHOULD EXPECT: Some feelings of bloating in the abdomen. Passage of more gas than usual.  Walking can help get rid of the air that was put into your GI tract during the procedure and reduce the bloating. If you had a lower endoscopy (such as a colonoscopy or flexible sigmoidoscopy) you may notice spotting of blood in your stool or on the toilet paper. If you underwent a bowel prep for your procedure, you may not have a normal bowel movement for a few days.  Please Note:  You might notice some irritation and congestion in your nose or some drainage.  This is from the oxygen used during your procedure.  There is no need for concern and it should clear up in a day or so.  SYMPTOMS TO REPORT IMMEDIATELY:  Following lower endoscopy (colonoscopy or flexible sigmoidoscopy):  Excessive amounts of blood in the stool  Significant tenderness or worsening of abdominal pains  Swelling of the abdomen that is new, acute  Fever of 100F or higher   For urgent or emergent issues, a gastroenterologist can be reached at any hour by calling [phone]. Do not use MyChart messaging for urgent  concerns.    DIET:  We do recommend a small meal at first, but then you may proceed to your regular diet.  Drink plenty of fluids but you should avoid alcoholic beverages for 24 hours.  ACTIVITY:  You should plan to take it easy for the rest of today and you should NOT DRIVE or use heavy machinery until tomorrow (because of the sedation medicines used during the test).    FOLLOW UP: Our staff will call the number listed on your records the next business day following your procedure.  We will call around 7:15- 8:00 am to check on you and address any questions or concerns that you may have regarding the information given to you following your procedure. If we do not reach you, we will leave a message.     If any biopsies were taken you will be contacted by phone or by letter within the next 1-3 weeks.  Please call us  at [phone] if you have not heard about the biopsies in 3 weeks.    SIGNATURES/CONFIDENTIALITY: You and/or your care partner have signed paperwork which will be entered into your electronic medical record.  These signatures attest to the fact that that the information above on your After Visit Summary has been reviewed and is understood.  Full responsibility of the confidentiality of this discharge information lies with you and/or your care-partner.

## 2024-01-16 ENCOUNTER — Telehealth: Payer: Self-pay | Admitting: *Deleted

## 2024-01-16 NOTE — Telephone Encounter (Signed)
Attempted to call patient for their post-procedure follow-up call. No answer. Left voicemail.   

## 2024-01-17 LAB — SURGICAL PATHOLOGY

## 2024-01-20 ENCOUNTER — Ambulatory Visit: Payer: Self-pay | Admitting: Pediatrics

## 2024-06-22 ENCOUNTER — Other Ambulatory Visit: Payer: Self-pay | Admitting: Family Medicine

## 2024-06-22 DIAGNOSIS — N5201 Erectile dysfunction due to arterial insufficiency: Secondary | ICD-10-CM

## 2024-06-22 NOTE — Telephone Encounter (Signed)
 Refill request for  Sildenafill 20 mg LF 111/12/24, #25, 2 rf LOV 11/08/23 FOV  11/10/24  Please review and advise.  Thanks. Dm/cma

## 2024-06-29 ENCOUNTER — Ambulatory Visit: Payer: Medicare Other

## 2024-06-29 VITALS — BP 118/62 | HR 62 | Temp 97.8°F | Ht 74.5 in | Wt 192.8 lb

## 2024-06-29 DIAGNOSIS — Z0000 Encounter for general adult medical examination without abnormal findings: Secondary | ICD-10-CM

## 2024-06-29 NOTE — Progress Notes (Signed)
 "  Chief Complaint  Patient presents with   Medicare Wellness     Subjective:   Joseph Jennings is a 69 y.o. male who presents for a Medicare Annual Wellness Visit.  Visit info / Clinical Intake: Medicare Wellness Visit Type:: Subsequent Annual Wellness Visit Persons participating in visit and providing information:: patient Medicare Wellness Visit Mode:: In-person (required for WTM) Interpreter Needed?: No Pre-visit prep was completed: yes AWV questionnaire completed by patient prior to visit?: no Living arrangements:: lives with spouse/significant other Patient's Overall Health Status Rating: excellent Typical amount of pain: none Does pain affect daily life?: no Are you currently prescribed opioids?: no  Dietary Habits and Nutritional Risks How many meals a day?: 3 Eats fruit and vegetables daily?: yes Most meals are obtained by: preparing own meals In the last 2 weeks, have you had any of the following?: none Diabetic:: no  Functional Status Activities of Daily Living (to include ambulation/medication): Independent Ambulation: Independent Medication Administration: Independent Home Management (perform basic housework or laundry): Independent Manage your own finances?: yes Primary transportation is: driving Concerns about vision?: no *vision screening is required for WTM* Concerns about hearing?: no  Fall Screening Falls in the past year?: 0 Number of falls in past year: 0 Was there an injury with Fall?: 0 Fall Risk Category Calculator: 0 Patient Fall Risk Level: Low Fall Risk  Fall Risk Patient at Risk for Falls Due to: No Fall Risks Fall risk Follow up: Falls prevention discussed; Falls evaluation completed  Home and Transportation Safety: All rugs have non-skid backing?: yes All stairs or steps have railings?: yes Grab bars in the bathtub or shower?: (!) no Have non-skid surface in bathtub or shower?: yes Good home lighting?: yes Regular seat belt use?:  yes Hospital stays in the last year:: no  Cognitive Assessment Difficulty concentrating, remembering, or making decisions? : no Will 6CIT or Mini Cog be Completed: yes What year is it?: 0 points What month is it?: 0 points Give patient an address phrase to remember (5 components): [address] [place] About what time is it?: 0 points Count backwards from 20 to 1: 0 points Say the months of the year in reverse: 0 points Repeat the address phrase from earlier: 2 points 6 CIT Score: 2 points  Advance Directives (For Healthcare) Does Patient Have a Medical Advance Directive?: Yes Does patient want to make changes to medical advance directive?: No - Patient declined Type of Advance Directive: Healthcare Power of [place]; Living will Copy of Healthcare Power of Attorney in Chart?: No - copy requested Copy of Living Will in Chart?: No - copy requested  Reviewed/Updated  Reviewed/Updated: Reviewed All (Medical, Surgical, Family, Medications, Allergies, Care Teams, Patient Goals)    Allergies (verified) Cetirizine, Diclofenac, and Other   Current Medications (verified) Outpatient Encounter Medications as of 06/29/2024  Medication Sig   albuterol  (VENTOLIN  HFA) 108 (90 Base) MCG/ACT inhaler Inhale 1-2 puffs into the lungs every 6 (six) hours as needed for wheezing or shortness of breath.   fluticasone -salmeterol (ADVAIR DISKUS) 250-50 MCG/ACT AEPB Inhale 1 puff into the lungs in the morning and at bedtime.   Quercetin 50 MG TABS 1 tablet   sildenafil  (REVATIO ) 20 MG tablet TAKE 3 TO 5 TABLETS BY MOUTH DAILY 45 MINUTES PRIOR TO INTERCOURSE   No facility-administered encounter medications on file as of 06/29/2024.    History: Past Medical History:  Diagnosis Date   Asthma    Past Surgical History:  Procedure Laterality Date  HIP SURGERY     Family History  Problem Relation Age of Onset   Hypertension Mother    Colon polyps Neg Hx    Colon cancer Neg Hx    Esophageal  cancer Neg Hx    Rectal cancer Neg Hx    Stomach cancer Neg Hx    Social History   Occupational History   Not on file  Tobacco Use   Smoking status: Never   Smokeless tobacco: Never  Vaping Use   Vaping status: Never Used  Substance and Sexual Activity   Alcohol use: Yes    Alcohol/week: 2.0 standard drinks of alcohol    Types: 1 Glasses of wine, 1 Cans of beer per week    Comment: social   Drug use: Never   Sexual activity: Yes   Tobacco Counseling Counseling given: Not Answered  SDOH Screenings   Food Insecurity: No Food Insecurity (06/29/2024)  Housing: Unknown (06/29/2024)  Transportation Needs: No Transportation Needs (06/29/2024)  Utilities: Not At Risk (06/29/2024)  Alcohol Screen: Low Risk (06/29/2024)  Depression (PHQ2-9): Low Risk (06/29/2024)  Financial Resource Strain: Low Risk (06/29/2024)  Physical Activity: Sufficiently Active (06/29/2024)  Social Connections: Socially Integrated (06/29/2024)  Stress: No Stress Concern Present (06/29/2024)  Tobacco Use: Low Risk (06/29/2024)  Health Literacy: Adequate Health Literacy (06/29/2024)   See flowsheets for full screening details  Depression Screen PHQ 2 & 9 Depression Scale- Over the past 2 weeks, how often have you been bothered by any of the following problems? Little interest or pleasure in doing things: 0 Feeling down, depressed, or hopeless (PHQ Adolescent also includes...irritable): 0 PHQ-2 Total Score: 0 Trouble falling or staying asleep, or sleeping too much: 0 Feeling tired or having little energy: 0 Poor appetite or overeating (PHQ Adolescent also includes...weight loss): 0 Feeling bad about yourself - or that you are a failure or have let yourself or your family down: 0 Trouble concentrating on things, such as reading the newspaper or watching television (PHQ Adolescent also includes...like school work): 0 Moving or speaking so slowly that other people could have noticed. Or the opposite - being so fidgety  or restless that you have been moving around a lot more than usual: 0 Thoughts that you would be better off dead, or of hurting yourself in some way: 0 PHQ-9 Total Score: 0 If you checked off any problems, how difficult have these problems made it for you to do your work, take care of things at home, or get along with other people?: Not difficult at all  Depression Treatment Depression Interventions/Treatment : [id number] Score <4 Follow-up Not Indicated     Goals Addressed             This Visit's Progress    Patient Stated       06/29/2024, remain healthy             Objective:    Today's Vitals   06/29/24 1106  BP: 118/62  Pulse: 62  Temp: 97.8 F (36.6 C)  TempSrc: Oral  SpO2: 97%  Weight: 192 lb 12.8 oz (87.5 kg)  Height: 6' 2.5 (1.892 m)   Body mass index is 24.42 kg/m.  Hearing/Vision screen Hearing Screening - Comments:: Denies hearing issues Vision Screening - Comments:: Regular eye  exams Immunizations and Health Maintenance Health Maintenance  Topic Date Due   Zoster Vaccines- Shingrix (1 of 2) Never done   Medicare Annual Wellness (AWV)  06/29/2025   Colonoscopy  01/15/2027   DTaP/Tdap/Td (3 -  Td or Tdap) 07/03/2028   Pneumococcal Vaccine: 50+ Years  Completed   Influenza Vaccine  Completed   Hepatitis C Screening  Completed   Meningococcal B Vaccine  Aged Out   COVID-19 Vaccine  Discontinued        Assessment/Plan:  This is a routine wellness examination for [place].  Patient Care Team: Berneta Elsie Sayre, MD as PCP - General (Family Medicine) Charmayne Molly, MD as Consulting Physician (Ophthalmology)  I have personally reviewed and noted the following in the patients chart:   Medical and social history Use of alcohol, tobacco or illicit drugs  Current medications and supplements including opioid prescriptions. Functional ability and status Nutritional status Physical activity Advanced directives List of other  physicians Hospitalizations, surgeries, and ER visits in previous 12 months Vitals Screenings to include cognitive, depression, and falls Referrals and appointments  No orders of the defined types were placed in this encounter.  In addition, I have reviewed and discussed with patient certain preventive protocols, quality metrics, and best practice recommendations. A written personalized care plan for preventive services as well as general preventive health recommendations were provided to patient.   Ardella FORBES Dawn, LPN   8/87/7973   Return in 1 year (on 06/29/2025).  After Visit Summary: (In Person-Declined) Patient declined AVS at this time.  Nurse Notes: Vaccines not given: Will obtain shingles at pharmacy  "

## 2024-06-29 NOTE — Patient Instructions (Signed)
 Joseph Jennings,  Thank you for taking the time for your Medicare Wellness Visit. I appreciate your continued commitment to your health goals. Please review the care plan we discussed, and feel free to reach out if I can assist you further.  Please note that Annual Wellness Visits do not include a physical exam. Some assessments may be limited, especially if the visit was conducted virtually. If needed, we may recommend an in-person follow-up with your provider.  Ongoing Care Seeing your primary care provider every 3 to 6 months helps us  monitor your health and provide consistent, personalized care.   Referrals If a referral was made during today's visit and you haven't received any updates within two weeks, please contact the referred provider directly to check on the status.  Recommended Screenings:  Health Maintenance  Topic Date Due   Zoster (Shingles) Vaccine (1 of 2) Never done   Medicare Annual Wellness Visit  06/23/2024   Colon Cancer Screening  01/15/2027   DTaP/Tdap/Td vaccine (3 - Td or Tdap) 07/03/2028   Pneumococcal Vaccine for age over 28  Completed   Flu Shot  Completed   Hepatitis C Screening  Completed   Meningitis B Vaccine  Aged Out   COVID-19 Vaccine  Discontinued       06/29/2024   11:10 AM  Advanced Directives  Does Patient Have a Medical Advance Directive? Yes  Type of Estate Agent of [place];Living will  Does patient want to make changes to medical advance directive? No - Patient declined  Copy of Healthcare Power of Attorney in Chart? No - copy requested    Vision: Annual vision screenings are recommended for early detection of glaucoma, cataracts, and diabetic retinopathy. These exams can also reveal signs of chronic conditions such as diabetes and high blood pressure.  Dental: Annual dental screenings help detect early signs of oral cancer, gum disease, and other conditions linked to overall health, including heart disease and  diabetes.  Please see the attached documents for additional preventive care recommendations.

## 2024-11-10 ENCOUNTER — Encounter: Admitting: Family Medicine

## 2025-07-05 ENCOUNTER — Ambulatory Visit
